# Patient Record
Sex: Female | Born: 1955 | Race: Black or African American | Hispanic: No | Marital: Single | Smoking: Current every day smoker
Health system: Southern US, Community
[De-identification: ages and names within clinical notes are randomized; demographics above are authoritative.]

## PROBLEM LIST (undated history)

## (undated) DIAGNOSIS — E119 Type 2 diabetes mellitus without complications: Secondary | ICD-10-CM

---

## 2004-06-27 ENCOUNTER — Emergency Department (HOSPITAL_COMMUNITY): Admission: EM | Admit: 2004-06-27 | Discharge: 2004-06-27 | Payer: Self-pay | Admitting: Emergency Medicine

## 2009-11-24 ENCOUNTER — Emergency Department (HOSPITAL_COMMUNITY)
Admission: EM | Admit: 2009-11-24 | Discharge: 2009-11-24 | Payer: Self-pay | Source: Home / Self Care | Admitting: Emergency Medicine

## 2010-07-09 ENCOUNTER — Inpatient Hospital Stay (HOSPITAL_COMMUNITY)
Admission: EM | Admit: 2010-07-09 | Discharge: 2010-07-13 | Payer: Self-pay | Source: Home / Self Care | Attending: Internal Medicine | Admitting: Internal Medicine

## 2010-07-10 LAB — POCT I-STAT, CHEM 8
BUN: 20 mg/dL (ref 6–23)
Calcium, Ion: 1.11 mmol/L — ABNORMAL LOW (ref 1.12–1.32)
Chloride: 106 meq/L (ref 96–112)
Creatinine, Ser: 1.3 mg/dL — ABNORMAL HIGH (ref 0.4–1.2)
Glucose, Bld: 436 mg/dL — ABNORMAL HIGH (ref 70–99)
HCT: 39 % (ref 36.0–46.0)
Hemoglobin: 13.3 g/dL (ref 12.0–15.0)
Potassium: 3.4 meq/L — ABNORMAL LOW (ref 3.5–5.1)
Sodium: 138 mEq/L (ref 135–145)
TCO2: 22 mmol/L (ref 0–100)

## 2010-07-10 LAB — DIFFERENTIAL
Basophils Absolute: 0 10*3/uL (ref 0.0–0.1)
Basophils Absolute: 0 10*3/uL (ref 0.0–0.1)
Basophils Relative: 0 % (ref 0–1)
Basophils Relative: 0 % (ref 0–1)
Eosinophils Absolute: 0 10*3/uL (ref 0.0–0.7)
Eosinophils Absolute: 0 10*3/uL (ref 0.0–0.7)
Eosinophils Relative: 0 % (ref 0–5)
Lymphocytes Relative: 3 % — ABNORMAL LOW (ref 12–46)
Lymphs Abs: 0.5 K/uL — ABNORMAL LOW (ref 0.7–4.0)
Monocytes Absolute: 0.4 10*3/uL (ref 0.1–1.0)
Monocytes Relative: 2 % — ABNORMAL LOW (ref 3–12)
Monocytes Relative: 3 % (ref 3–12)
Neutro Abs: 17.3 K/uL — ABNORMAL HIGH (ref 1.7–7.7)
Neutrophils Relative %: 95 % — ABNORMAL HIGH (ref 43–77)
Neutrophils Relative %: 92 % — ABNORMAL HIGH (ref 43–77)

## 2010-07-10 LAB — CBC
HCT: 36.3 % (ref 36.0–46.0)
Hemoglobin: 12.2 g/dL (ref 12.0–15.0)
MCH: 27.6 pg (ref 26.0–34.0)
MCH: 27.4 pg (ref 26.0–34.0)
MCHC: 33.6 g/dL (ref 30.0–36.0)
MCHC: 33.6 g/dL (ref 30.0–36.0)
MCV: 82.1 fL (ref 78.0–100.0)
Platelets: 236 10*3/uL (ref 150–400)
Platelets: 235 10*3/uL (ref 150–400)
RBC: 4.42 MIL/uL (ref 3.87–5.11)
RBC: 4.01 MIL/uL (ref 3.87–5.11)
RDW: 13.6 % (ref 11.5–15.5)
WBC: 18.2 K/uL — ABNORMAL HIGH (ref 4.0–10.5)

## 2010-07-10 LAB — URINE MICROSCOPIC-ADD ON

## 2010-07-10 LAB — URINALYSIS, ROUTINE W REFLEX MICROSCOPIC
Bilirubin Urine: NEGATIVE
Ketones, ur: NEGATIVE mg/dL
Nitrite: POSITIVE — AB
Protein, ur: 30 mg/dL — AB
Specific Gravity, Urine: 1.027 (ref 1.005–1.030)
Urine Glucose, Fasting: >1000 mg/dL — AB
Urobilinogen, UA: 1 mg/dL (ref 0.0–1.0)
pH: 5.5 (ref 5.0–8.0)

## 2010-07-10 LAB — BASIC METABOLIC PANEL
Calcium: 8.7 mg/dL (ref 8.4–10.5)
Chloride: 110 mEq/L (ref 96–112)
Creatinine, Ser: 1.14 mg/dL (ref 0.4–1.2)
GFR calc Af Amer: >60 mL/min (ref >60)

## 2010-07-10 LAB — GLUCOSE, CAPILLARY
Glucose-Capillary: 392 mg/dL — ABNORMAL HIGH (ref 70–99)
Glucose-Capillary: 190 mg/dL — ABNORMAL HIGH (ref 70–99)
Glucose-Capillary: 121 mg/dL — ABNORMAL HIGH (ref 70–99)
Glucose-Capillary: 267 mg/dL — ABNORMAL HIGH (ref 70–99)
Glucose-Capillary: 187 mg/dL — ABNORMAL HIGH (ref 70–99)

## 2010-07-11 LAB — MAGNESIUM: Magnesium: 2.2 mg/dL (ref 1.5–2.5)

## 2010-07-11 LAB — GLUCOSE, CAPILLARY
Glucose-Capillary: 232 mg/dL — ABNORMAL HIGH (ref 70–99)
Glucose-Capillary: 135 mg/dL — ABNORMAL HIGH (ref 70–99)
Glucose-Capillary: 151 mg/dL — ABNORMAL HIGH (ref 70–99)
Glucose-Capillary: 227 mg/dL — ABNORMAL HIGH (ref 70–99)
Glucose-Capillary: 263 mg/dL — ABNORMAL HIGH (ref 70–99)
Glucose-Capillary: 176 mg/dL — ABNORMAL HIGH (ref 70–99)
Glucose-Capillary: 214 mg/dL — ABNORMAL HIGH (ref 70–99)
Glucose-Capillary: 202 mg/dL — ABNORMAL HIGH (ref 70–99)

## 2010-07-11 LAB — CBC
HCT: 30.4 % — ABNORMAL LOW (ref 36.0–46.0)
HCT: 29 % — ABNORMAL LOW (ref 36.0–46.0)
Hemoglobin: 10 g/dL — ABNORMAL LOW (ref 12.0–15.0)
MCH: 26.8 pg (ref 26.0–34.0)
MCHC: 32.9 g/dL (ref 30.0–36.0)
MCHC: 33.8 g/dL (ref 30.0–36.0)
RDW: 14 % (ref 11.5–15.5)

## 2010-07-11 LAB — URINE CULTURE
Colony Count: >=100000
Culture  Setup Time: 201201231228

## 2010-07-11 LAB — BASIC METABOLIC PANEL
CO2: 21 mEq/L (ref 19–32)
CO2: 20 mEq/L (ref 19–32)
Calcium: 8.3 mg/dL — ABNORMAL LOW (ref 8.4–10.5)
Calcium: 8.6 mg/dL (ref 8.4–10.5)
Creatinine, Ser: 0.86 mg/dL (ref 0.4–1.2)
GFR calc Af Amer: >60 mL/min (ref >60)
Glucose, Bld: 150 mg/dL — ABNORMAL HIGH (ref 70–99)
Sodium: 141 mEq/L (ref 135–145)

## 2010-07-11 LAB — IRON AND TIBC
Iron: 30 ug/dL — ABNORMAL LOW (ref 42–135)
Saturation Ratios: 13 % — ABNORMAL LOW (ref 20–55)
UIBC: 204 ug/dL

## 2010-07-11 LAB — FERRITIN: Ferritin: 1234 ng/mL — ABNORMAL HIGH (ref 10–291)

## 2010-07-12 LAB — GLUCOSE, CAPILLARY
Glucose-Capillary: 148 mg/dL — ABNORMAL HIGH (ref 70–99)
Glucose-Capillary: 173 mg/dL — ABNORMAL HIGH (ref 70–99)
Glucose-Capillary: 276 mg/dL — ABNORMAL HIGH (ref 70–99)
Glucose-Capillary: 250 mg/dL — ABNORMAL HIGH (ref 70–99)
Glucose-Capillary: 274 mg/dL — ABNORMAL HIGH (ref 70–99)

## 2010-07-12 LAB — CULTURE, BLOOD (ROUTINE X 2): Culture  Setup Time: 201201231106

## 2010-07-12 LAB — HEMOGLOBIN A1C
Hgb A1c MFr Bld: 9 % — ABNORMAL HIGH (ref <5.7)
Mean Plasma Glucose: 212 mg/dL — ABNORMAL HIGH (ref <117)

## 2010-07-13 LAB — GLUCOSE, CAPILLARY: Glucose-Capillary: 157 mg/dL — ABNORMAL HIGH (ref 70–99)

## 2010-07-13 NOTE — Discharge Summary (Signed)
[name], [name]                ACCOUNT NO.:  [id number]  MEDICAL RECORD NO.:  [id number]          PATIENT TYPE:  INP  LOCATION:  1604                         FACILITY:  [facility]  PHYSICIAN:  Clydia Llano, MD       DATE OF BIRTH:  1955-10-23  DATE OF ADMISSION:  07/09/2010 DATE OF DISCHARGE:                              DISCHARGE SUMMARY   PRIMARY CARE PHYSICIAN:  [name], M.D.  REASON FOR ADMISSION:  Fever, chills.  DISCHARGE DIAGNOSES: 1. Escherichia-coli bacteremia. 2. Escherichia-coli urinary tract infection. 3. Type 2 diabetes mellitus. 4. Anemia.  DISCHARGE MEDICATIONS: 1. Ciprofloxacin 500 mg p.o. b.i.d. take for 11 more days. 2. Actoplus XR 30/1000 mg one tab q.h.s. 3. Aspirin 81 mg p.o. daily.  RADIOLOGY:  Chest x-ray showed lingular opacity, scarring, atelectasis, or developing infiltrates.  BRIEF HISTORY AND EXAMINATION:  Ms. [name] is 55 year old female who presented to the emergency department with complaint of fever, chills, myalgia.  The patient also is coughing for the past 2 days.  The patient reports having dysuria and urinary frequency.  The patient stated that blood sugar have been elevated also.  The patient has been evaluated in the emergency department.  She was found to have urinalysis and questionable pneumonia.  The patient started on Rocephin, azithromycin, referred for medical admission. 1. E-coli UTI and bacteremia.  After the patient's admission, was     started on IV antibiotics.  Her urine culture grew E coli which is     susceptible ciprofloxacin.  The patient was on Rocephin for 1 day     and switched to Zosyn to cover both UTI and pneumonia, but the     patient as mentioned has questionable pneumonia.  She never     complained about any cough during the hospital stay or productive     of sputums, so the patient likely did not have pneumonia.  Because     of the chills and the leukocytosis, blood culture were obtained, 1  out of 2 blood culture grew E coli which has probably came from the     UTI.  The patient will be treated with ciprofloxacin for total 14     days. 2. Diabetes mellitus type 2.  The patient has uncontrolled diabetes     mellitus.  Her hemoglobin A1c is 9, which correlated with mean     plasma glucose of 212.  The patient on metformin 1000 mg extended     release as well as Actos.  The patient probably needs more glycemic     control.  The patient will follow up with her primary care     physician.  She might need actually to be started on insulin. 3. Anemia.  Upon admission in the hospital, the patient's hemoglobin     was 12.2 and dropped in 2 days to around 10.  This is probably the     cause of hemodilution.  When she came in, her blood was     concentrated because of dehydration and after she got the fluid,     that was dropped down.  Anemia panel was obtained and showed iron     of 30 and ferritin is 1234, which is correlated anemia of chronic     disease with a low iron.  It might be also that she has high     ferritin because it is acute-phase reactant, was high just because     of the acute illness.  Upon questioning, the patient did not have     any black stools or history of bleeding before.  The patient needs     to follow up as out patient with her primary care physician for      further evaluation.     Clydia Llano, MD     ME/MEDQ  D:  07/13/2010  T:  07/13/2010  Job:  [id number]  cc:   Candyce Churn. Allyne Cooper, M.D. Fax: [phone]  Electronically Signed by Clydia Llano  on 07/13/2010 08:28:56 PM

## 2010-07-15 LAB — CULTURE, BLOOD (ROUTINE X 2)
Culture  Setup Time: 201201231106
Culture: NO GROWTH

## 2010-08-20 NOTE — H&P (Signed)
[name], [name] NO.:  [id number]  MEDICAL RECORD NO.:  [id number]          PATIENT TYPE:  INP  LOCATION:  1604                         FACILITY:  [facility]  PHYSICIAN:  Della Goo, M.D. DATE OF BIRTH:  06-09-1956  DATE OF ADMISSION:  07/09/2010 DATE OF DISCHARGE:                             HISTORY & PHYSICAL   DATE OF ADMISSION:  July 09, 2010.  CHIEF COMPLAINT:  Fever, chills, cough.  HISTORY OF PRESENT ILLNESS:  This is a 55 year old female who presents to the emergency department with complaints of fever, chills, myalgias and cough for the past 2 days.  She also reports having dysuria and urinary frequency.  She states her blood sugars have been elevated.  The patient was seen and evaluated in the emergency department.  She was found to have a pneumonia on chest x-ray and a positive urinalysis and was started on IV antibiotic therapy of Rocephin and azithromycin and referred for medical admission.  PAST MEDICAL HISTORY:  Significant for type 2 diabetes mellitus.  PAST SURGICAL HISTORY:  History of a bilateral tubal ligation.  MEDICATIONS:  Include Actoplus 30/500 one p.o. daily  ALLERGIES:  No known drug allergies.  SOCIAL HISTORY:  The patient is a smoker.  She smokes 4 cigarettes per day for the past 25 years.  She reports social drinking.  Denies any illicit drug usage.  FAMILY HISTORY:  Positive for hypertension and diabetes in both parents, coronary artery disease in her father and her mother had kidney cancer.  REVIEW OF SYSTEMS:  Pertinents as mentioned above.  PHYSICAL EXAMINATION FINDINGS:  GENERAL:  This is a 55 year old well- nourished, well-developed female in no acute distress. VITAL SIGNS:  Temperature 102.5, blood pressure 112/69, heart rate 110, respirations 18, O2 sats 95% to 98%. HEENT EXAMINATION:  Normocephalic, atraumatic.  Pupils equally round and reactive to light.  Extraocular movements are intact.   Funduscopic benign.  Nares are patent bilaterally.  Oropharynx is clear. NECK:  Supple full range of motion.  No thyromegaly, adenopathy, or jugular venous distention. CARDIOVASCULAR:  Mild tachycardia.  No murmurs, gallops, or rubs appreciated. LUNGS:  Clear to auscultation bilaterally.  Breathing is unlabored. Excursion is symmetric. ABDOMEN:  Positive bowel sounds, soft, nontender, nondistended.  No hepatosplenomegaly.  No suprapubic tenderness. BACK:  No spinous process tenderness.  No CVA tenderness. EXTREMITIES:  Without cyanosis, clubbing or edema. NEUROLOGIC EXAMINATION:  Nonfocal.  LABORATORY STUDIES:  White blood cell count 18.2, hemoglobin 12.2, hematocrit 36.3, platelets 236,000, neutrophils 95%, lymphocytes 3%. Sodium 138, potassium 3.4, chloride 106, CO2 22, BUN 20, creatinine 1.3, and glucose 436.  ASSESSMENT:  A 55 year old female being admitted with; 1. Pneumonia left lingular area seen on chest x-ray. 2. Urinary tract infection. 3. Hyperglycemia. 4. Type 2 diabetes mellitus. 5. Mild hypokalemia.  PLAN:  The patient will be admitted.  Blood cultures x2 have been ordered, and the patient will be placed on Rocephin and azithromycin therapy.  Nebulizer treatments have been ordered along with IV fluids and supplemental oxygen as needed.  The antibiotic therapy should cover the urinary tract infection as well.  However, the antibiotics will  be adjusted pending the culture results of the blood cultures and the urine culture.  Sliding-scale insulin coverage has been ordered for elevated blood sugars.  The Actoplus will be discontinued for now.  The patient will be placed on DVT prophylaxis.  Further workup will ensue pending results of the patient's clinical course.     Della Goo, M.D.     HJ/MEDQ  D:  07/09/2010  T:  07/09/2010  Job:  [id number]  Electronically Signed by Della Goo M.D. on 08/20/2010 07:42:05 PM

## 2016-04-27 ENCOUNTER — Emergency Department (HOSPITAL_COMMUNITY)
Admission: EM | Admit: 2016-04-27 | Discharge: 2016-04-27 | Disposition: A | Discharge status: Home / Self Care | Payer: Worker's Compensation | Attending: Emergency Medicine | Admitting: Emergency Medicine

## 2016-04-27 ENCOUNTER — Encounter (HOSPITAL_COMMUNITY): Payer: Self-pay | Admitting: *Deleted

## 2016-04-27 DIAGNOSIS — Y990 Civilian activity done for income or pay: Secondary | ICD-10-CM | POA: Insufficient documentation

## 2016-04-27 DIAGNOSIS — Z23 Encounter for immunization: Secondary | ICD-10-CM | POA: Diagnosis not present

## 2016-04-27 DIAGNOSIS — S61241A Puncture wound with foreign body of left index finger without damage to nail, initial encounter: Secondary | ICD-10-CM | POA: Diagnosis not present

## 2016-04-27 DIAGNOSIS — F17200 Nicotine dependence, unspecified, uncomplicated: Secondary | ICD-10-CM | POA: Diagnosis not present

## 2016-04-27 DIAGNOSIS — Y9269 Other specified industrial and construction area as the place of occurrence of the external cause: Secondary | ICD-10-CM | POA: Insufficient documentation

## 2016-04-27 DIAGNOSIS — S6992XA Unspecified injury of left wrist, hand and finger(s), initial encounter: Secondary | ICD-10-CM | POA: Diagnosis present

## 2016-04-27 DIAGNOSIS — W458XXA Other foreign body or object entering through skin, initial encounter: Secondary | ICD-10-CM | POA: Insufficient documentation

## 2016-04-27 DIAGNOSIS — M795 Residual foreign body in soft tissue: Secondary | ICD-10-CM

## 2016-04-27 DIAGNOSIS — Y9389 Activity, other specified: Secondary | ICD-10-CM | POA: Diagnosis not present

## 2016-04-27 MED ORDER — LIDOCAINE HCL (PF) 2 % IJ SOLN
2.0000 mL | Freq: Once | INTRAMUSCULAR | Status: DC
  Filled 2016-04-27: qty 10

## 2016-04-27 MED ORDER — TETANUS-DIPHTH-ACELL PERTUSSIS 5-2.5-18.5 LF-MCG/0.5 IM SUSP
0.5000 mL | Freq: Once | INTRAMUSCULAR | Status: AC
  Administered 2016-04-27: 0.5 mL via INTRAMUSCULAR
  Filled 2016-04-27: qty 0.5

## 2016-04-27 NOTE — ED Triage Notes (Signed)
Pt with "threader" in left index finger.

## 2016-04-27 NOTE — ED Notes (Signed)
Taken to lab for urine drug screen for employment requirement- Ed and lab personnel informed

## 2016-04-27 NOTE — ED Provider Notes (Signed)
[facility]-EMERGENCY DEPT Provider Note   CSN: [id number] Arrival date & time: 04/27/16  2020     History   Chief Complaint Chief Complaint  Patient presents with  . Foreign Body    HPI [name] is a 60 y.o. female presenting with a knitting needle in her left index finger which occurred at work prior to arrival.  She works in Designer, fashion/clothingtextiles that [employer] and accidentally was stabbed with a needle.  Her employer, also present has an intact needle to show and it is a small metal rod with a hook at the end resembling a crochet needle.  She has attempted to remove it but was to painful.  She denies numbness distal to the injury site.  She is unsure of her tetanus status.  The history is provided by the patient.    History reviewed. No pertinent past medical history.  There are no active problems to display for this patient.   History reviewed. No pertinent surgical history.  OB History    No data available       Home Medications    Prior to Admission medications   Not on File    Family History History reviewed. No pertinent family history.  Social History Social History  Substance Use Topics  . Smoking status: Current Every Day Smoker  . Smokeless tobacco: Never Used  . Alcohol use Yes     Comment: occ.     Allergies   Patient has no known allergies.   Review of Systems Review of Systems  Constitutional: Negative for chills and fever.  HENT: Negative.   Musculoskeletal: Negative.   Skin: Positive for wound.  Neurological: Negative for numbness.     Physical Exam Updated Vital Signs BP 145/84 (BP Location: Right Arm)   Pulse 85   Temp 97.7 F (36.5 C) (Oral)   Resp 18   SpO2 100%   Physical Exam  Constitutional: She appears well-developed and well-nourished. No distress.  HENT:  Head: Normocephalic.  Neck: Neck supple.  Cardiovascular: Normal rate.   Pulmonary/Chest: Effort normal. She has no wheezes.  Musculoskeletal:  Normal range of motion. She exhibits no edema.  Skin:  Metal hook embedded in left distal index finger, volar.  Distal sensation intact.      ED Treatments / Results  Labs (all labs ordered are listed, but only abnormal results are displayed) Labs Reviewed - No data to display  EKG  EKG Interpretation None       Radiology No results found.  Procedures Procedures (including critical care time)  Pt was given a digital block using lidocaine 2% 1 cc after cleaning finger with wound cleaner spray.  Once numbed, the hook was removed using #18 gauge needle to cover the barb and back out.  Pt tolerated well.  Medications Ordered in ED Medications  lidocaine (XYLOCAINE) 2 % injection 2 mL (2 mLs Other Handoff 04/27/16 2137)  Tdap (BOOSTRIX) injection 0.5 mL (0.5 mLs Intramuscular Given 04/27/16 2142)     Initial Impression / Assessment and Plan / ED Course  I have reviewed the triage vital signs and the nursing notes.  Pertinent labs & imaging results that were available during my care of the patient were reviewed by me and considered in my medical decision making (see chart for details).  Clinical Course     Dressing.  Tetanus updated.  PRN f/u anticipated  Final Clinical Impressions(s) / ED Diagnoses   Final diagnoses:  Foreign body (FB) in soft  tissue    New Prescriptions New Prescriptions   No medications on file     Burgess AmorJulie Shelagh Rayman, Cordelia Poche-C 04/27/16 2145    Samuel JesterKathleen McManus, DO 04/28/16 2215

## 2016-09-20 ENCOUNTER — Observation Stay (HOSPITAL_COMMUNITY)
Admission: EM | Admit: 2016-09-20 | Discharge: 2016-09-21 | Disposition: A | Discharge status: Home / Self Care | Payer: BLUE CROSS/BLUE SHIELD | Attending: Internal Medicine | Admitting: Internal Medicine

## 2016-09-20 ENCOUNTER — Encounter (HOSPITAL_COMMUNITY): Payer: Self-pay | Admitting: *Deleted

## 2016-09-20 ENCOUNTER — Emergency Department (HOSPITAL_COMMUNITY): Payer: BLUE CROSS/BLUE SHIELD

## 2016-09-20 DIAGNOSIS — Z7982 Long term (current) use of aspirin: Secondary | ICD-10-CM | POA: Insufficient documentation

## 2016-09-20 DIAGNOSIS — N179 Acute kidney failure, unspecified: Principal | ICD-10-CM | POA: Diagnosis present

## 2016-09-20 DIAGNOSIS — I959 Hypotension, unspecified: Secondary | ICD-10-CM

## 2016-09-20 DIAGNOSIS — Z794 Long term (current) use of insulin: Secondary | ICD-10-CM | POA: Insufficient documentation

## 2016-09-20 DIAGNOSIS — E1165 Type 2 diabetes mellitus with hyperglycemia: Secondary | ICD-10-CM | POA: Diagnosis not present

## 2016-09-20 DIAGNOSIS — N289 Disorder of kidney and ureter, unspecified: Secondary | ICD-10-CM

## 2016-09-20 DIAGNOSIS — R739 Hyperglycemia, unspecified: Secondary | ICD-10-CM

## 2016-09-20 DIAGNOSIS — R42 Dizziness and giddiness: Secondary | ICD-10-CM | POA: Diagnosis present

## 2016-09-20 DIAGNOSIS — F17200 Nicotine dependence, unspecified, uncomplicated: Secondary | ICD-10-CM | POA: Diagnosis not present

## 2016-09-20 DIAGNOSIS — E119 Type 2 diabetes mellitus without complications: Secondary | ICD-10-CM

## 2016-09-20 HISTORY — DX: Type 2 diabetes mellitus without complications: E11.9

## 2016-09-20 LAB — BASIC METABOLIC PANEL
ANION GAP: 9 (ref 5–15)
BUN: 20 mg/dL (ref 6–20)
CALCIUM: 8.8 mg/dL — AB (ref 8.9–10.3)
CO2: 22 mmol/L (ref 22–32)
Chloride: 103 mmol/L (ref 101–111)
Creatinine, Ser: 1.93 mg/dL — ABNORMAL HIGH (ref 0.44–1.00)
GFR calc non Af Amer: 27 mL/min — ABNORMAL LOW (ref >60)
GFR, EST AFRICAN AMERICAN: 32 mL/min — AB (ref >60)
Glucose, Bld: 357 mg/dL — ABNORMAL HIGH (ref 65–99)
Potassium: 4 mmol/L (ref 3.5–5.1)
Sodium: 134 mmol/L — ABNORMAL LOW (ref 135–145)

## 2016-09-20 LAB — URINALYSIS, ROUTINE W REFLEX MICROSCOPIC
Bilirubin Urine: NEGATIVE
GLUCOSE, UA: 50 mg/dL — AB
Hgb urine dipstick: NEGATIVE
KETONES UR: NEGATIVE mg/dL
NITRITE: NEGATIVE
PH: 5 (ref 5.0–8.0)
Protein, ur: 100 mg/dL — AB
SPECIFIC GRAVITY, URINE: 1.01 (ref 1.005–1.030)

## 2016-09-20 LAB — CBC WITH DIFFERENTIAL/PLATELET
Basophils Absolute: 0 10*3/uL (ref 0.0–0.1)
Basophils Relative: 0 %
Eosinophils Absolute: 0 10*3/uL (ref 0.0–0.7)
Eosinophils Relative: 0 %
HEMATOCRIT: 39.8 % (ref 36.0–46.0)
HEMOGLOBIN: 13 g/dL (ref 12.0–15.0)
LYMPHS ABS: 1.4 10*3/uL (ref 0.7–4.0)
LYMPHS PCT: 14 %
MCH: 27 pg (ref 26.0–34.0)
MCHC: 32.7 g/dL (ref 30.0–36.0)
MCV: 82.7 fL (ref 78.0–100.0)
MONOS PCT: 12 %
Monocytes Absolute: 1.2 10*3/uL — ABNORMAL HIGH (ref 0.1–1.0)
NEUTROS ABS: 7.4 10*3/uL (ref 1.7–7.7)
Neutrophils Relative %: 74 %
Platelets: 224 10*3/uL (ref 150–400)
RBC: 4.81 MIL/uL (ref 3.87–5.11)
RDW: 13.7 % (ref 11.5–15.5)
WBC: 10 10*3/uL (ref 4.0–10.5)

## 2016-09-20 LAB — TROPONIN I: Troponin I: <0.03 ng/mL (ref <0.03)

## 2016-09-20 LAB — CBG MONITORING, ED: Glucose-Capillary: 322 mg/dL — ABNORMAL HIGH (ref 65–99)

## 2016-09-20 MED ORDER — MECLIZINE HCL 25 MG PO TABS
25.0000 mg | ORAL_TABLET | Freq: Once | ORAL | Status: AC
  Administered 2016-09-20: 25 mg via ORAL
  Filled 2016-09-20: qty 1

## 2016-09-20 NOTE — ED Notes (Signed)
Pt's cbg =322

## 2016-09-20 NOTE — ED Notes (Signed)
EKG aquired with portable ekg machine.

## 2016-09-20 NOTE — ED Triage Notes (Signed)
Pt complains of lightheadedness, blurred vision since 4PM today. Pt states she has been taking allergy medication for the past 2 days for cough, sneezing. Pt's CBG was 263 at home.

## 2016-09-20 NOTE — ED Provider Notes (Signed)
[facility]-EMERGENCY DEPT Provider Note   CSN: [id number] Arrival date & time: 09/20/16  1936     History   Chief Complaint Chief Complaint  Patient presents with  . Blurred Vision  . Dizziness    HPI [name] is a 61 y.o. female.  Patient presents with lightheadedness, blurred vision, since approximately 1600 today. She started her blood pressure medicine approximately one month ago (lisinopril 10 mg).  No gross neurological deficits, fever, chills, meningeal signs. She works at a [employer] as a Public affairs consultant. Review of systems positive for coughing and sneezing secondary to allergies. Her glucose has been in the 160-260 range.      Past Medical History:  Diagnosis Date  . Diabetes mellitus without complication (HCC)     There are no active problems to display for this patient.   History reviewed. No pertinent surgical history.  OB History    No data available       Home Medications    Prior to Admission medications   Medication Sig Start Date End Date Taking? Authorizing Provider  aspirin EC 81 MG tablet Take 81 mg by mouth daily.   Yes Historical Provider, MD  B Complex-Folic Acid (B COMPLEX-VITAMIN B12 PO) Take 1 tablet by mouth daily.   Yes Historical Provider, MD  ibuprofen (ADVIL,MOTRIN) 200 MG tablet Take 200 mg by mouth every 6 (six) hours as needed for moderate pain.   Yes Historical Provider, MD  insulin detemir (LEVEMIR) 100 UNIT/ML injection Inject 22 Units into the skin at bedtime.   Yes Historical Provider, MD  lisinopril (PRINIVIL,ZESTRIL) 10 MG tablet Take 10 mg by mouth daily.   Yes Historical Provider, MD  Multiple Vitamins-Minerals (ALIVE ONCE DAILY WOMENS PO) Take 1 tablet by mouth daily.   Yes Historical Provider, MD  simvastatin (ZOCOR) 10 MG tablet Take 10 mg by mouth daily.   Yes Historical Provider, MD  tetrahydrozoline-zinc (EYE DROPS ALLERGY RELIEF) 0.05-0.25 % ophthalmic solution Place 2 drops into both eyes 3 (three) times daily as needed.  Itching   Yes Historical Provider, MD    Family History No family history on file.  Social History Social History  Substance Use Topics  . Smoking status: Current Every Day Smoker  . Smokeless tobacco: Never Used  . Alcohol use Yes     Comment: occ.     Allergies   Patient has no known allergies.   Review of Systems Review of Systems  All other systems reviewed and are negative.    Physical Exam Updated Vital Signs BP 107/77 (BP Location: Right Arm)   Pulse 73   Temp 97.9 F (36.6 C) (Oral)   Resp 12   Wt 143 lb 8 oz (65.1 kg)   SpO2 99%   Physical Exam  Constitutional: She is oriented to person, place, and time. She appears well-developed and well-nourished.  Mild hypotension  HENT:  Head: Normocephalic and atraumatic.  Eyes: Conjunctivae are normal.  Neck: Neck supple.  Cardiovascular: Normal rate and regular rhythm.   Pulmonary/Chest: Effort normal and breath sounds normal.  Abdominal: Soft. Bowel sounds are normal.  Musculoskeletal: Normal range of motion.  Neurological: She is alert and oriented to person, place, and time.  Skin: Skin is warm and dry.  Psychiatric: She has a normal mood and affect. Her behavior is normal.  Nursing note and vitals reviewed.    ED Treatments / Results  Labs (all labs ordered are listed, but only abnormal results are displayed) Labs Reviewed  BASIC  METABOLIC PANEL - Abnormal; Notable for the following:       Result Value   Sodium 134 (*)    Glucose, Bld 357 (*)    Creatinine, Ser 1.93 (*)    Calcium 8.8 (*)    GFR calc non Af Amer 27 (*)    GFR calc Af Amer 32 (*)    All other components within normal limits  URINALYSIS, ROUTINE W REFLEX MICROSCOPIC - Abnormal; Notable for the following:    APPearance CLOUDY (*)    Glucose, UA 50 (*)    Protein, ur 100 (*)    Leukocytes, UA TRACE (*)    Bacteria, UA RARE (*)    Squamous Epithelial / LPF 0-5 (*)    Crystals PRESENT (*)    All other components within normal  limits  CBC WITH DIFFERENTIAL/PLATELET - Abnormal; Notable for the following:    Monocytes Absolute 1.2 (*)    All other components within normal limits  CBG MONITORING, ED - Abnormal; Notable for the following:    Glucose-Capillary 322 (*)    All other components within normal limits  TROPONIN I    EKG  EKG Interpretation None      Date: 09/20/2016  Rate: 74  Rhythm: normal sinus rhythm  QRS Axis: normal  Intervals: normal  ST/T Wave abnormalities: normal  Conduction Disutrbances: none  Narrative Interpretation: unremarkable     Radiology Ct Head Wo Contrast  Result Date: 09/20/2016 CLINICAL DATA:  Dizziness and near syncope.  Blurred vision. EXAM: CT HEAD WITHOUT CONTRAST TECHNIQUE: Contiguous axial images were obtained from the base of the skull through the vertex without intravenous contrast. COMPARISON:  None. FINDINGS: Brain: No mass lesion, intraparenchymal hemorrhage or extra-axial collection. No evidence of acute cortical infarct. Brain parenchyma and CSF-containing spaces are normal for age. Bilateral basal ganglia mineralization. Vascular: No hyperdense vessel or unexpected calcification. Skull: Normal visualized skull base, calvarium and extracranial soft tissues. Sinuses/Orbits: No sinus fluid levels or advanced mucosal thickening. No mastoid effusion. Normal orbits. IMPRESSION: Normal head CT for age. Electronically Signed   By: Deatra Robinson M.D.   On: 09/20/2016 21:40    Procedures Procedures (including critical care time)  Medications Ordered in ED Medications  sodium chloride 0.9 % bolus 1,000 mL (not administered)  meclizine (ANTIVERT) tablet 25 mg (25 mg Oral Given 09/20/16 2156)     Initial Impression / Assessment and Plan / ED Course  I have reviewed the triage vital signs and the nursing notes.  Pertinent labs & imaging results that were available during my care of the patient were reviewed by me and considered in my medical decision making (see chart  for details).     I'm concerned about the patient's hypotension and her elevated creatinine. These both could be related to the lisinopril. Additionally her glucose is elevated. Will hydrate and admit for observation.  Final Clinical Impressions(s) / ED Diagnoses   Final diagnoses:  Dizziness  Hyperglycemia  Renal insufficiency    New Prescriptions New Prescriptions   No medications on file     Donnetta Hutching, MD 09/21/16 0050

## 2016-09-21 DIAGNOSIS — R42 Dizziness and giddiness: Secondary | ICD-10-CM | POA: Diagnosis not present

## 2016-09-21 DIAGNOSIS — R739 Hyperglycemia, unspecified: Secondary | ICD-10-CM

## 2016-09-21 DIAGNOSIS — E119 Type 2 diabetes mellitus without complications: Secondary | ICD-10-CM | POA: Diagnosis not present

## 2016-09-21 DIAGNOSIS — Z794 Long term (current) use of insulin: Secondary | ICD-10-CM

## 2016-09-21 DIAGNOSIS — I959 Hypotension, unspecified: Secondary | ICD-10-CM

## 2016-09-21 DIAGNOSIS — N179 Acute kidney failure, unspecified: Secondary | ICD-10-CM | POA: Diagnosis present

## 2016-09-21 LAB — HEPATIC FUNCTION PANEL
ALBUMIN: 3 g/dL — AB (ref 3.5–5.0)
ALK PHOS: 92 U/L (ref 38–126)
ALT: 20 U/L (ref 14–54)
AST: 23 U/L (ref 15–41)
BILIRUBIN TOTAL: 0.4 mg/dL (ref 0.3–1.2)
Bilirubin, Direct: <0.1 mg/dL — ABNORMAL LOW (ref 0.1–0.5)
Total Protein: 6.1 g/dL — ABNORMAL LOW (ref 6.5–8.1)

## 2016-09-21 LAB — BASIC METABOLIC PANEL
ANION GAP: 7 (ref 5–15)
BUN: 17 mg/dL (ref 6–20)
CO2: 21 mmol/L — AB (ref 22–32)
Calcium: 8.2 mg/dL — ABNORMAL LOW (ref 8.9–10.3)
Chloride: 110 mmol/L (ref 101–111)
Creatinine, Ser: 0.96 mg/dL (ref 0.44–1.00)
GFR calc non Af Amer: >60 mL/min (ref >60)
Glucose, Bld: 272 mg/dL — ABNORMAL HIGH (ref 65–99)
Potassium: 3.5 mmol/L (ref 3.5–5.1)
Sodium: 138 mmol/L (ref 135–145)

## 2016-09-21 LAB — GLUCOSE, CAPILLARY
GLUCOSE-CAPILLARY: 257 mg/dL — AB (ref 65–99)
Glucose-Capillary: 173 mg/dL — ABNORMAL HIGH (ref 65–99)
Glucose-Capillary: 128 mg/dL — ABNORMAL HIGH (ref 65–99)

## 2016-09-21 MED ORDER — SODIUM CHLORIDE 0.9 % IV SOLN
INTRAVENOUS | Status: DC
  Administered 2016-09-21: 02:00:00 via INTRAVENOUS

## 2016-09-21 MED ORDER — INSULIN DETEMIR 100 UNIT/ML ~~LOC~~ SOLN: 25.0000 [IU] | Freq: Every day | SUBCUTANEOUS | 11 refills | Status: DC

## 2016-09-21 MED ORDER — LORATADINE 10 MG PO TABS
10.0000 mg | ORAL_TABLET | Freq: Every day | ORAL | Status: DC
  Administered 2016-09-21: 10 mg via ORAL
  Filled 2016-09-21: qty 1

## 2016-09-21 MED ORDER — SODIUM CHLORIDE 0.9% FLUSH: 3.0000 mL | Freq: Two times a day (BID) | INTRAVENOUS | Status: DC

## 2016-09-21 MED ORDER — SODIUM CHLORIDE 0.9 % IV BOLUS (SEPSIS)
1000.0000 mL | Freq: Once | INTRAVENOUS | Status: AC
  Administered 2016-09-21: 1000 mL via INTRAVENOUS

## 2016-09-21 MED ORDER — ASPIRIN EC 81 MG PO TBEC
81.0000 mg | DELAYED_RELEASE_TABLET | Freq: Every day | ORAL | Status: DC
  Administered 2016-09-21: 81 mg via ORAL
  Filled 2016-09-21: qty 1

## 2016-09-21 MED ORDER — INSULIN DETEMIR 100 UNIT/ML ~~LOC~~ SOLN
22.0000 [IU] | Freq: Every day | SUBCUTANEOUS | Status: DC
  Filled 2016-09-21 (×2): qty 0.22

## 2016-09-21 MED ORDER — INSULIN ASPART 100 UNIT/ML ~~LOC~~ SOLN
0.0000 [IU] | Freq: Three times a day (TID) | SUBCUTANEOUS | Status: DC
  Administered 2016-09-21: 8 [IU] via SUBCUTANEOUS
  Administered 2016-09-21: 2 [IU] via SUBCUTANEOUS

## 2016-09-21 MED ORDER — ACETAMINOPHEN 650 MG RE SUPP: 650.0000 mg | Freq: Four times a day (QID) | RECTAL | Status: DC | PRN

## 2016-09-21 MED ORDER — ONDANSETRON HCL 4 MG PO TABS: 4.0000 mg | ORAL_TABLET | Freq: Four times a day (QID) | ORAL | Status: DC | PRN

## 2016-09-21 MED ORDER — INSULIN ASPART 100 UNIT/ML ~~LOC~~ SOLN: 0.0000 [IU] | Freq: Every day | SUBCUTANEOUS | Status: DC

## 2016-09-21 MED ORDER — LIVING WELL WITH DIABETES BOOK
Freq: Once | Status: DC
  Filled 2016-09-21: qty 1

## 2016-09-21 MED ORDER — ONDANSETRON HCL 4 MG/2ML IJ SOLN: 4.0000 mg | Freq: Four times a day (QID) | INTRAMUSCULAR | Status: DC | PRN

## 2016-09-21 MED ORDER — SIMVASTATIN 10 MG PO TABS: 10.0000 mg | ORAL_TABLET | Freq: Every day | ORAL | Status: DC

## 2016-09-21 MED ORDER — SODIUM CHLORIDE 0.9 % IV BOLUS (SEPSIS)
500.0000 mL | Freq: Once | INTRAVENOUS | Status: AC
  Administered 2016-09-21: 500 mL via INTRAVENOUS

## 2016-09-21 MED ORDER — ACETAMINOPHEN 325 MG PO TABS: 650.0000 mg | ORAL_TABLET | Freq: Four times a day (QID) | ORAL | Status: DC | PRN

## 2016-09-21 NOTE — Progress Notes (Signed)
Discharged instructions reviewed with patient utilizing teach back method. Writer and patient spoke with diabetes coordinator Bjorn Loser) because patient had questions about her DM management. Patient discharged to home

## 2016-09-21 NOTE — Discharge Instructions (Signed)
Orthostatic Hypotension Orthostatic hypotension is a sudden drop in blood pressure that happens when you quickly change positions, such as when you get up from a seated or lying position. Blood pressure is a measurement of how strongly, or weakly, your blood is pressing against the walls of your arteries. Arteries are blood vessels that carry blood from your heart throughout your body. When blood pressure is too low, you may not get enough blood to your brain or to the rest of your organs. This can cause weakness, light-headedness, rapid heartbeat, and fainting. This can last for just a few seconds or for up to a few minutes. Orthostatic hypotension is usually not a serious problem. However, if it happens frequently or gets worse, it may be a sign of something more serious. What are the causes? This condition may be caused by:  Sudden changes in posture, such as standing up quickly after you have been sitting or lying down.  Blood loss.  Loss of body fluids (dehydration).  Heart problems.  Hormone (endocrine) problems.  Pregnancy.  Severe infection.  Lack of certain nutrients.  Severe allergic reactions (anaphylaxis).  Certain medicines, such as blood pressure medicine or medicines that make the body lose excess fluids (diuretics). Sometimes, this condition can be caused by not taking medicine as directed, such as taking too much of a certain medicine. What increases the risk? Certain factors can make you more likely to develop orthostatic hypotension, including:  Age. Risk increases as you get older.  Conditions that affect the heart or the central nervous system.  Taking certain medicines, such as blood pressure medicine or diuretics.  Being pregnant. What are the signs or symptoms? Symptoms of this condition may include:  Weakness.  Light-headedness.  Dizziness.  Blurred vision.  Fatigue.  Rapid heartbeat.  Fainting, in severe cases. How is this diagnosed? This  condition is diagnosed based on:  Your medical history.  Your symptoms.  Your blood pressure measurement. Your health care provider will check your blood pressure when you are:  Lying down.  Sitting.  Standing. A blood pressure reading is recorded as two numbers, such as "120 over 80" (or 120/80). The first ("top") number is called the systolic pressure. It is a measure of the pressure in your arteries as your heart beats. The second ("bottom") number is called the diastolic pressure. It is a measure of the pressure in your arteries when your heart relaxes between beats. Blood pressure is measured in a unit called mm Hg. Healthy blood pressure for adults is 120/80. If your blood pressure is below 90/60, you may be diagnosed with hypotension. Other information or tests that may be used to diagnose orthostatic hypotension include:  Your other vital signs, such as your heart rate and temperature.  Blood tests.  Tilt table test. For this test, you will be safely secured to a table that moves you from a lying position to an upright position. Your heart rhythm and blood pressure will be monitored during the test. How is this treated? Treatment for this condition may include:  Changing your diet. This may involve eating more salt (sodium) or drinking more water.  Taking medicines to raise your blood pressure.  Changing the dosage of certain medicines you are taking that might be lowering your blood pressure.  Wearing compression stockings. These stockings help to prevent blood clots and reduce swelling in your legs. In some cases, you may need to go to the hospital for:  Fluid replacement. This means you will   receive fluids through an IV tube.  Blood replacement. This means you will receive donated blood through an IV tube (transfusion).  Treating an infection or heart problems, if this applies.  Monitoring. You may need to be monitored while medicines that you are taking wear  off. Follow these instructions at home: Eating and drinking    Drink enough fluid to keep your urine clear or pale yellow.  Eat a healthy diet and follow instructions from your health care provider about eating or drinking restrictions. A healthy diet includes:  Fresh fruits and vegetables.  Whole grains.  Lean meats.  Low-fat dairy products.  Eat extra salt only as directed. Do not add extra salt to your diet unless your health care provider told you to do that.  Eat frequent, small meals.  Avoid standing up suddenly after eating. Medicines   Take over-the-counter and prescription medicines only as told by your health care provider.  Follow instructions from your health care provider about changing the dosage of your current medicines, if this applies.  Do not stop or adjust any of your medicines on your own. General instructions   Wear compression stockings as told by your health care provider.  Get up slowly from lying down or sitting positions. This gives your blood pressure a chance to adjust.  Avoid hot showers and excessive heat as directed by your health care provider.  Return to your normal activities as told by your health care provider. Ask your health care provider what activities are safe for you.  Do not use any products that contain nicotine or tobacco, such as cigarettes and e-cigarettes. If you need help quitting, ask your health care provider.  Keep all follow-up visits as told by your health care provider. This is important. Contact a health care provider if:  You vomit.  You have diarrhea.  You have a fever for more than 2-3 days.  You feel more thirsty than usual.  You feel weak and tired. Get help right away if:  You have chest pain.  You have a fast or irregular heartbeat.  You develop numbness in any part of your body.  You cannot move your arms or your legs.  You have trouble speaking.  You become sweaty or feel  lightheaded.  You faint.  You feel short of breath.  You have trouble staying awake.  You feel confused. This information is not intended to replace advice given to you by your health care provider. Make sure you discuss any questions you have with your health care provider. Document Released: 05/24/2002 Document Revised: 02/20/2016 Document Reviewed: 11/24/2015 Elsevier Interactive Patient Education  2017 [employer]  

## 2016-09-21 NOTE — Progress Notes (Addendum)
Pt had an EKG in ED as ordered, has not crossed over to epic, hard copy shown to MD in ED by Jari Favre, RN. Jari Favre, RN states that the hard copy is going to medical record to be scanned into epic. [name]

## 2016-09-21 NOTE — Discharge Summary (Signed)
[facility]  Physician Discharge Summary   Patient ID: [name] MRN: [id number] DOB/AGE: 12-12-1955 61 y.o.  Admit date: 09/20/2016 Discharge date: 09/21/2016  PCP: No PCP Per Patient  DISCHARGE DIAGNOSES:  Principal Problem:   AKI (acute kidney injury) (HCC) Active Problems:   Hypotension   Diabetes (HCC)   RECOMMENDATIONS FOR OUTPATIENT FOLLOW UP: 1. Patient instructed to follow-up with her primary care provider early next week 2. She was asked to reschedule her routine colonoscopy  DISCHARGE CONDITION: fair  Diet recommendation: Modified carbohydrate  Filed Weights   09/20/16 1941 09/21/16 0159  Weight: 65.1 kg (143 lb 8 oz) 76.4 kg (168 lb 6.9 oz)    INITIAL HISTORY: 61 y.o. woman with a history of Type 2 DM, HLD, and seasonal allergies who presented to the ED for evaluation of intermittent dizziness, blurred vision, and headaches for the past several days.  She has felt light-headed, but she had not passed out. She was recently started on lisinopril.   HOSPITAL COURSE:   Acute Kidney injury Patient creatinine was noted to be 1.9. Acute renal failure occurred in the setting of relative hypotension, all likely driven by addition of lisinopril in the past month. She may also have a small degree of dehydration in the setting of recent URI. Lisinopril was held. She was given normal saline. Orthostatics were checked and her blood pressure did drop some with standing position. After fluid boluses orthostatics were checked again this morning and showed improvement. She ambulated without any difficulty. Creatinine is now normal. Recommend continue to hold her lisinopril for now.  Uncontrolled Type 2 Diabetes Her dose of Levemir was increased. She was asked to follow-up with her primary care provider.  Hyperlipidemia  Continue Statin  URI, likely some component of seasonal allergies with recent changes in weather Low index of suspicion for bacterial process  at this point. She remained afebrile. Symptoms have improved.  Of note, due for colonoscopy on Tuesday. Patient advised to follow up with her PCP early next week. It may not be unreasonable for her routine colonoscopy to be rescheduled.   Overall, stable. Renal failure has resolved. Okay for discharge home.   PERTINENT LABS:  The results of significant diagnostics from this hospitalization (including imaging, microbiology, ancillary and laboratory) are listed below for reference.     Labs: Basic Metabolic Panel:  Recent Labs Lab 09/20/16 2101 09/21/16 0700  NA 134* 138  K 4.0 3.5  CL 103 110  CO2 22 21*  GLUCOSE 357* 272*  BUN 20 17  CREATININE 1.93* 0.96  CALCIUM 8.8* 8.2*   Liver Function Tests:  Recent Labs Lab 09/21/16 0700  AST 23  ALT 20  ALKPHOS 92  BILITOT 0.4  PROT 6.1*  ALBUMIN 3.0*   CBC:  Recent Labs Lab 09/20/16 2102  WBC 10.0  NEUTROABS 7.4  HGB 13.0  HCT 39.8  MCV 82.7  PLT 224   Cardiac Enzymes:  Recent Labs Lab 09/20/16 2101  TROPONINI <0.03   CBG:  Recent Labs Lab 09/20/16 2117 09/21/16 0208 09/21/16 0722 09/21/16 1125  GLUCAP 322* 173* 257* 128*     IMAGING STUDIES Ct Head Wo Contrast  Result Date: 09/20/2016 CLINICAL DATA:  Dizziness and near syncope.  Blurred vision. EXAM: CT HEAD WITHOUT CONTRAST TECHNIQUE: Contiguous axial images were obtained from the base of the skull through the vertex without intravenous contrast. COMPARISON:  None. FINDINGS: Brain: No mass lesion, intraparenchymal hemorrhage or extra-axial collection. No evidence of acute cortical infarct. Brain  parenchyma and CSF-containing spaces are normal for age. Bilateral basal ganglia mineralization. Vascular: No hyperdense vessel or unexpected calcification. Skull: Normal visualized skull base, calvarium and extracranial soft tissues. Sinuses/Orbits: No sinus fluid levels or advanced mucosal thickening. No mastoid effusion. Normal orbits. IMPRESSION: Normal  head CT for age. Electronically Signed   By: Deatra Robinson M.D.   On: 09/20/2016 21:40    DISCHARGE EXAMINATION: Vitals:   09/21/16 0159 09/21/16 0201 09/21/16 0204 09/21/16 0543  BP: 116/73 103/71 90/62 94/62   Pulse: 68 74 79 80  Resp: 18   18  Temp: 98.9 F (37.2 C)   99.3 F (37.4 C)  TempSrc: Oral   Oral  SpO2:    94%  Weight: 76.4 kg (168 lb 6.9 oz)     Height:  (1.702 m)      Orthostatic VS for the past 24 hrs:  BP- Lying Pulse- Lying BP- Sitting Pulse- Sitting BP- Standing at 0 minutes Pulse- Standing at 0 minutes  09/21/16 1100 123/78 75 115/77 77 111/73 80  09/21/16 0204 - - - - 90/62 79  09/21/16 0201 - - 103/71 74 - -  09/21/16 0159 116/73 68 - - - -     General appearance: alert, cooperative, appears stated age and no distress Resp: clear to auscultation bilaterally Cardio: regular rate and rhythm, S1, S2 normal, no murmur, click, rub or gallop GI: soft, non-tender; bowel sounds normal; no masses,  no organomegaly Extremities: extremities normal, atraumatic, no cyanosis or edema Neurologic: Alert and oriented X 3, normal strength and tone. Normal symmetric reflexes. Normal coordination and gait  DISPOSITION: Home  Discharge Instructions    Call MD for:  difficulty breathing, headache or visual disturbances    Complete by:  As directed    Call MD for:  extreme fatigue    Complete by:  As directed    Call MD for:  persistant dizziness or light-headedness    Complete by:  As directed    Call MD for:  persistant nausea and vomiting    Complete by:  As directed    Call MD for:  severe uncontrolled pain    Complete by:  As directed    Call MD for:  temperature >100.4    Complete by:  As directed    Diet Carb Modified    Complete by:  As directed    Discharge instructions    Complete by:  As directed    Please stop taking Lisinopril for now. See your primary care physician early next week. It is reasonable at this time to postpone your routine colonoscopy  until you've completely recovered. Keep yourself well hydrated for the next few days. Get up slowly from a sitting or lying down position. Your diabetes is not well controlled. Dose of insulin has been increased slightly. Discuss this further with your PCP.  cared for by a hospitalist during your hospital stay. If you have any questions about your discharge medications or the care you received while you were in the hospital after you are discharged, you can call the unit and asked to speak with the hospitalist on call if the hospitalist that took care of you is not available. Once you are discharged, your primary care physician will handle any further medical issues. Please note that NO REFILLS for any discharge medications will be authorized once you are discharged, as it is imperative that you return to your primary care physician (or establish a relationship with a primary care physician  if you do not have one) for your aftercare needs so that they can reassess your need for medications and monitor your lab values. If you do not have a primary care physician, you can call [phone] for a physician referral.   Increase activity slowly    Complete by:  As directed       ALLERGIES: No Known Allergies   Discharge Medication List as of 09/21/2016 12:55 PM    CONTINUE these medications which have CHANGED   Details  insulin detemir (LEVEMIR) 100 UNIT/ML injection Inject 0.25 mLs (25 Units total) into the skin at bedtime., Starting Sat 09/21/2016, No Print      CONTINUE these medications which have NOT CHANGED   Details  aspirin EC 81 MG tablet Take 81 mg by mouth daily., Historical Med    B Complex-Folic Acid (B COMPLEX-VITAMIN B12 PO) Take 1 tablet by mouth daily., Historical Med    Multiple Vitamins-Minerals (ALIVE ONCE DAILY WOMENS PO) Take 1 tablet by mouth daily., Historical Med    simvastatin (ZOCOR) 10 MG tablet Take 10 mg by mouth daily., Historical Med    tetrahydrozoline-zinc (EYE DROPS  ALLERGY RELIEF) 0.05-0.25 % ophthalmic solution Place 2 drops into both eyes 3 (three) times daily as needed. Itching, Historical Med      STOP taking these medications     ibuprofen (ADVIL,MOTRIN) 200 MG tablet      lisinopril (PRINIVIL,ZESTRIL) 10 MG tablet          TOTAL DISCHARGE TIME: 35 mins  [facility]  [facility] Pager [phone]  09/21/2016, 2:34 PM

## 2016-09-21 NOTE — H&P (Signed)
History and Physical    DEWANNA HURSTON ZOX:[id number] DOB: 06-Jul-1955 DOA: 09/20/2016  PCP: PCP is in [place], [place]  Patient coming from: Work  Chief Complaint: Dizziness and blurred vision  HPI: [name] is a 61 y.o. woman with a history of Type 2 DM, HLD, and seasonal allergies who presents to the ED for evaluation of intermittent dizziness, blurred vision, and headaches for the past several days.  She has felt light-headed, but she has not passed out.  She has also had cough, sore throat, and nasal congestion since Monday.  She has been taking an OTC cough and cold prep that contains phenylephrine.  Her cough is nonproductive.  She denies post-nasal drip.  No known sick contacts.  No fever.  She has had mild nausea but no vomiting.  No diarrhea.  Of note, her PCP put her on insulin for her uncontrolled diabetes in the past month and he also started lisinopril "for renal protection".  Per the patient, she has never had high blood pressure.  ED Course:  BP in triage 88/60.  Improved without specific intervention.  Creatinine 1.93, which is new.  Glucose 357.  Head CT negative.  Troponin negative.  She received one dose of meclizine.  Hospitalist asked to admit for AKI in the setting of relative hypotension.  Review of Systems: As per HPI otherwise 10 systems reviewed and negative.    Past Medical History:  Diagnosis Date  . Diabetes mellitus without complication (HCC)   HLD  History reviewed. No pertinent surgical history.  The patient denies major surgeries.   reports that she has been smoking.  She has never used smokeless tobacco. She reports that she drinks alcohol. She reports that she does not use drugs.  Occasional tobacco and EtOH use but no history of dependence.  No Known Allergies  FAMILY HISTORY: Diabetes and HTN are prevalent.  Parents are deceased.  Father had prostate cancer.  Mother had metastatic cancer (patient unsure of primary).  Prior to Admission  medications   Medication Sig Start Date End Date Taking? Authorizing Provider  aspirin EC 81 MG tablet Take 81 mg by mouth daily.   Yes Historical Provider, MD  B Complex-Folic Acid (B COMPLEX-VITAMIN B12 PO) Take 1 tablet by mouth daily.   Yes Historical Provider, MD  ibuprofen (ADVIL,MOTRIN) 200 MG tablet Take 200 mg by mouth every 6 (six) hours as needed for moderate pain.   Yes Historical Provider, MD  insulin detemir (LEVEMIR) 100 UNIT/ML injection Inject 22 Units into the skin at bedtime.   Yes Historical Provider, MD  lisinopril (PRINIVIL,ZESTRIL) 10 MG tablet Take 10 mg by mouth daily.   Yes Historical Provider, MD  Multiple Vitamins-Minerals (ALIVE ONCE DAILY WOMENS PO) Take 1 tablet by mouth daily.   Yes Historical Provider, MD  simvastatin (ZOCOR) 10 MG tablet Take 10 mg by mouth daily.   Yes Historical Provider, MD  tetrahydrozoline-zinc (EYE DROPS ALLERGY RELIEF) 0.05-0.25 % ophthalmic solution Place 2 drops into both eyes 3 (three) times daily as needed. Itching   Yes Historical Provider, MD    Physical Exam: Vitals:   09/20/16 1941 09/20/16 1944 09/20/16 2004 09/20/16 2226  BP: (!) 88/60 (!) 86/60 101/63 107/77  Pulse: 80  74 73  Resp: Temp: 97.8 F (36.6 C)  97.8 F (36.6 C) 97.9 F (36.6 C)  TempSrc: Oral  Oral Oral  SpO2: 99%  97% 99%  Weight: 65.1 kg (143 lb 8 oz)  Constitutional: NAD, calm, comfortable, NONtoxic appearing Vitals:   09/20/16 1941 09/20/16 1944 09/20/16 2004 09/20/16 2226  BP: (!) 88/60 (!) 86/60 101/63 107/77  Pulse: 80  74 73  Resp: Temp: 97.8 F (36.6 C)  97.8 F (36.6 C) 97.9 F (36.6 C)  TempSrc: Oral  Oral Oral  SpO2: 99%  97% 99%  Weight: 65.1 kg (143 lb 8 oz)      Eyes: PERRL, lids and conjunctivae normal ENMT: Mucous membranes are moist. Posterior pharynx clear of any exudate or lesions. Normal dentition.  Neck: normal appearance, supple, no masses Respiratory: clear to auscultation bilaterally, no  wheezing, no crackles. Normal respiratory effort. No accessory muscle use.  Cardiovascular: Normal rate, regular rhythm, no murmurs / rubs / gallops. No extremity edema. 2+ pedal pulses.  GI: abdomen is soft and compressible.  No distention.  No tenderness.  Bowel sounds are present. Musculoskeletal:  No joint deformity in upper and lower extremities. Good ROM, no contractures. Normal muscle tone.  Skin: no rashes, warm and dry Neurologic: CN 2-12 grossly intact. Sensation intact, Strength symmetric bilaterally. Psychiatric: Normal judgment and insight. Alert and oriented x 3. Normal mood.     Labs on Admission: I have personally reviewed following labs and imaging studies  CBC:  Recent Labs Lab 09/20/16 2102  WBC 10.0  NEUTROABS 7.4  HGB 13.0  HCT 39.8  MCV 82.7  PLT 224   Basic Metabolic Panel:  Recent Labs Lab 09/20/16 2101  NA 134*  K 4.0  CL 103  CO2 22  GLUCOSE 357*  BUN 20  CREATININE 1.93*  CALCIUM 8.8*   GFR: CrCl cannot be calculated (Unknown ideal weight.).  Cardiac Enzymes:  Recent Labs Lab 09/20/16 2101  TROPONINI <0.03   CBG:  Recent Labs Lab 09/20/16 2117  GLUCAP 322*   Urine analysis:    Component Value Date/Time   COLORURINE YELLOW 09/20/2016 2101   APPEARANCEUR CLOUDY (A) 09/20/2016 2101   LABSPEC 1.010 09/20/2016 2101   PHURINE 5.0 09/20/2016 2101   GLUCOSEU 50 (A) 09/20/2016 2101   HGBUR NEGATIVE 09/20/2016 2101   BILIRUBINUR NEGATIVE 09/20/2016 2101   KETONESUR NEGATIVE 09/20/2016 2101   PROTEINUR 100 (A) 09/20/2016 2101   UROBILINOGEN 1.0 07/09/2010 0047   NITRITE NEGATIVE 09/20/2016 2101   LEUKOCYTESUR TRACE (A) 09/20/2016 2101    Radiological Exams on Admission: Ct Head Wo Contrast  Result Date: 09/20/2016 CLINICAL DATA:  Dizziness and near syncope.  Blurred vision. EXAM: CT HEAD WITHOUT CONTRAST TECHNIQUE: Contiguous axial images were obtained from the base of the skull through the vertex without intravenous contrast.  COMPARISON:  None. FINDINGS: Brain: No mass lesion, intraparenchymal hemorrhage or extra-axial collection. No evidence of acute cortical infarct. Brain parenchyma and CSF-containing spaces are normal for age. Bilateral basal ganglia mineralization. Vascular: No hyperdense vessel or unexpected calcification. Skull: Normal visualized skull base, calvarium and extracranial soft tissues. Sinuses/Orbits: No sinus fluid levels or advanced mucosal thickening. No mastoid effusion. Normal orbits. IMPRESSION: Normal head CT for age. Electronically Signed   By: Deatra Robinson M.D.   On: 09/20/2016 21:40    EKG: Pending  Assessment/Plan Principal Problem:   AKI (acute kidney injury) (HCC) Active Problems:   Hypotension   Diabetes (HCC)      AKI in the setting of relative hypotension, all likely driven by addition of lisinopril in the past month.  She may also have a small degree of dehydration in the setting of recent URI. --HOLD lisinopril --Hydrate  with NS --Urine does not appear infected --Will check orthostatics --Repeat BMP in the AM  Uncontrolled Type 2 Diabetes --Continue home dose of lisinopril --SSI AC/HS --Diet is inconsistent (she works nights and sleeps most of the day).  Will likely need short acting insulin and/or oral meds in addition to levemir for better long-term control. --Would benefit from diabetes education.  HLD --Statin  URI, likely some component of seasonal allergies with recent changes in weather --Low index of suspicion for bacterial process at this point --Claritin, guaifenesin prn  Of note, due for colonoscopy on Tuesday.  I advised patient to see PCP next week to make sure labs and BP are stable (and symptoms have resolved) after this admission.  Would anticipate re-scheduling colonoscopy after follow-up with PCP.      DVT prophylaxis: Low risk, early ambulation Code Status: FULL Family Communication: Daughter and sister present in the ED at the time of  admission. Disposition Plan: Expect she will go home tomorrow. Consults called: NONE Admission status: Place in observation with telemetry monitoring.   TIME SPENT: 50 minutes   Jerene Bears MD [facility] Pager [phone]  If 7PM-7AM, please contact night-coverage [email or web address] Password [email or web address]  09/21/2016, 1:06 AM

## 2021-07-11 ENCOUNTER — Emergency Department (HOSPITAL_COMMUNITY): Payer: BC Managed Care – PPO

## 2021-07-11 ENCOUNTER — Inpatient Hospital Stay (HOSPITAL_COMMUNITY)
Admission: EM | Admit: 2021-07-11 | Discharge: 2021-07-13 | DRG: 348 | Disposition: A | Discharge status: Home / Self Care | Payer: BC Managed Care – PPO | Attending: Internal Medicine | Admitting: Internal Medicine

## 2021-07-11 ENCOUNTER — Other Ambulatory Visit: Payer: Self-pay

## 2021-07-11 ENCOUNTER — Encounter (HOSPITAL_COMMUNITY): Payer: Self-pay | Admitting: *Deleted

## 2021-07-11 DIAGNOSIS — K610 Anal abscess: Secondary | ICD-10-CM | POA: Diagnosis not present

## 2021-07-11 DIAGNOSIS — L0231 Cutaneous abscess of buttock: Secondary | ICD-10-CM | POA: Diagnosis not present

## 2021-07-11 DIAGNOSIS — E1165 Type 2 diabetes mellitus with hyperglycemia: Secondary | ICD-10-CM | POA: Diagnosis present

## 2021-07-11 DIAGNOSIS — Z7982 Long term (current) use of aspirin: Secondary | ICD-10-CM

## 2021-07-11 DIAGNOSIS — F17200 Nicotine dependence, unspecified, uncomplicated: Secondary | ICD-10-CM | POA: Diagnosis present

## 2021-07-11 DIAGNOSIS — I10 Essential (primary) hypertension: Secondary | ICD-10-CM | POA: Diagnosis present

## 2021-07-11 DIAGNOSIS — Z720 Tobacco use: Secondary | ICD-10-CM | POA: Diagnosis present

## 2021-07-11 DIAGNOSIS — Z794 Long term (current) use of insulin: Secondary | ICD-10-CM

## 2021-07-11 DIAGNOSIS — R739 Hyperglycemia, unspecified: Secondary | ICD-10-CM | POA: Diagnosis present

## 2021-07-11 DIAGNOSIS — Z20822 Contact with and (suspected) exposure to covid-19: Secondary | ICD-10-CM | POA: Diagnosis present

## 2021-07-11 DIAGNOSIS — N179 Acute kidney failure, unspecified: Secondary | ICD-10-CM | POA: Diagnosis present

## 2021-07-11 DIAGNOSIS — I96 Gangrene, not elsewhere classified: Secondary | ICD-10-CM | POA: Diagnosis present

## 2021-07-11 DIAGNOSIS — L03317 Cellulitis of buttock: Secondary | ICD-10-CM | POA: Diagnosis present

## 2021-07-11 DIAGNOSIS — E119 Type 2 diabetes mellitus without complications: Secondary | ICD-10-CM

## 2021-07-11 DIAGNOSIS — L0291 Cutaneous abscess, unspecified: Secondary | ICD-10-CM

## 2021-07-11 LAB — CBC WITH DIFFERENTIAL/PLATELET
Abs Immature Granulocytes: 0.1 10*3/uL — ABNORMAL HIGH (ref 0.00–0.07)
Basophils Absolute: 0.1 10*3/uL (ref 0.0–0.1)
Basophils Relative: 1 %
Eosinophils Absolute: 0.2 10*3/uL (ref 0.0–0.5)
Eosinophils Relative: 2 %
HCT: 35 % — ABNORMAL LOW (ref 36.0–46.0)
Hemoglobin: 10.7 g/dL — ABNORMAL LOW (ref 12.0–15.0)
Immature Granulocytes: 1 %
Lymphocytes Relative: 15 %
Lymphs Abs: 1.9 10*3/uL (ref 0.7–4.0)
MCH: 25.9 pg — ABNORMAL LOW (ref 26.0–34.0)
MCHC: 30.6 g/dL (ref 30.0–36.0)
MCV: 84.7 fL (ref 80.0–100.0)
Monocytes Absolute: 0.9 10*3/uL (ref 0.1–1.0)
Monocytes Relative: 8 %
Neutro Abs: 8.9 10*3/uL — ABNORMAL HIGH (ref 1.7–7.7)
Neutrophils Relative %: 73 %
Platelets: 513 10*3/uL — ABNORMAL HIGH (ref 150–400)
RBC: 4.13 MIL/uL (ref 3.87–5.11)
RDW: 14.6 % (ref 11.5–15.5)
WBC: 12.1 10*3/uL — ABNORMAL HIGH (ref 4.0–10.5)
nRBC: 0.2 % (ref 0.0–0.2)

## 2021-07-11 LAB — COMPREHENSIVE METABOLIC PANEL
ALT: 11 U/L (ref 0–44)
AST: 12 U/L — ABNORMAL LOW (ref 15–41)
Albumin: 2.6 g/dL — ABNORMAL LOW (ref 3.5–5.0)
Alkaline Phosphatase: 130 U/L — ABNORMAL HIGH (ref 38–126)
Anion gap: 7 (ref 5–15)
BUN: 15 mg/dL (ref 8–23)
CO2: 20 mmol/L — ABNORMAL LOW (ref 22–32)
Calcium: 8.5 mg/dL — ABNORMAL LOW (ref 8.9–10.3)
Chloride: 106 mmol/L (ref 98–111)
Creatinine, Ser: 1.38 mg/dL — ABNORMAL HIGH (ref 0.44–1.00)
GFR, Estimated: 43 mL/min — ABNORMAL LOW (ref >60)
Glucose, Bld: 397 mg/dL — ABNORMAL HIGH (ref 70–99)
Potassium: 4.8 mmol/L (ref 3.5–5.1)
Sodium: 133 mmol/L — ABNORMAL LOW (ref 135–145)
Total Bilirubin: 0.5 mg/dL (ref 0.3–1.2)
Total Protein: 6.8 g/dL (ref 6.5–8.1)

## 2021-07-11 MED ORDER — MORPHINE SULFATE (PF) 4 MG/ML IV SOLN
4.0000 mg | Freq: Once | INTRAVENOUS | Status: AC
  Administered 2021-07-11: 23:00:00 4 mg via INTRAVENOUS
  Filled 2021-07-11: qty 1

## 2021-07-11 MED ORDER — VANCOMYCIN HCL IN DEXTROSE 1-5 GM/200ML-% IV SOLN
1000.0000 mg | Freq: Once | INTRAVENOUS | Status: AC
  Administered 2021-07-11: 1000 mg via INTRAVENOUS
  Filled 2021-07-11: qty 200

## 2021-07-11 MED ORDER — IOHEXOL 350 MG/ML SOLN
80.0000 mL | Freq: Once | INTRAVENOUS | Status: AC | PRN
  Administered 2021-07-11: 18:00:00 80 mL via INTRAVENOUS

## 2021-07-11 MED ORDER — SODIUM CHLORIDE 0.9 % IV BOLUS
1000.0000 mL | Freq: Once | INTRAVENOUS | Status: AC
  Administered 2021-07-11: 1000 mL via INTRAVENOUS

## 2021-07-11 MED ORDER — SODIUM CHLORIDE 0.9 % IV SOLN
2.0000 g | Freq: Once | INTRAVENOUS | Status: AC
  Administered 2021-07-11: 23:00:00 2 g via INTRAVENOUS
  Filled 2021-07-11: qty 2

## 2021-07-11 NOTE — ED Provider Triage Note (Signed)
Emergency Medicine Provider Triage Evaluation Note  PATTRICIA WEIHER , a 66 y.o. female  was evaluated in triage.  Pt complains of abscess to vulva/buttocks area since 1/14.  Patient was seen for this complaint previously urgent care given 10-day course of Bactrim as well as Rocephin shot in clinic.  Patient states that she has completed this course but that buttocks abscess continues.  Patient has history of poorly controlled diabetes.  Abscess is draining currently.  Review of Systems  Positive: Fevers, chills Negative: Nausea, vomiting, cp, sob  Physical Exam  BP 136/67 (BP Location: Right Arm)    Pulse 90    Temp 98.7 F (37.1 C) (Oral)    Resp 18    SpO2 95%  Gen:   Awake, no distress   Resp:  Normal effort  MSK:   Moves extremities without difficulty  Other:  Patient has erythematous abscess noted to right side of buttocks/vulva. Draining  Medical Decision Making  Medically screening exam initiated at 1:02 PM.  Appropriate orders placed.  Graylin Shiver was informed that the remainder of the evaluation will be completed by another provider, this initial triage assessment does not replace that evaluation, and the importance of remaining in the ED until their evaluation is complete.     Al Decant, PA-C 07/11/21 1305

## 2021-07-11 NOTE — ED Triage Notes (Signed)
Pt reports having an abscess for 3 weeks. Was seen at pcp on 1/14 for vulvar and buttock abscess and was treated with IM rocephin and 10 days of bactrim but reports no relief. Denies fever.

## 2021-07-11 NOTE — Consult Note (Signed)
Reason for Consult:gluteal abscess  °Referring Physician: Dr. Kommor ° °[name] is an 65 y.o. female.  °HPI: 65 Fwith h/o DM and R gluteal abscess x3weeks. ° °Pt was seen by PCP and given bactrim and rocephin.  Area had unimproved.  Pt with pain and presented for further eval. ° °Pt underwent CT which showed 5.5x5.6 cm R gluteal fluid collection.  Pt with leukocytosis and elev BS. ° °Pt given Abx per EDP. ° °I review the CT and Lab studies personally. ° °Past Medical History:  °Diagnosis Date  ° Diabetes mellitus without complication (HCC)   ° ° °History reviewed. No pertinent surgical history. ° °History reviewed. No pertinent family history. ° °Social History:  reports that she has been smoking. She has never used smokeless tobacco. She reports current alcohol use. She reports that she does not use drugs. ° °Allergies: No Known Allergies ° °Medications: I have reviewed the patient's current medications. ° °Results for orders placed or performed during the hospital encounter of 07/11/21 (from the past 48 hour(s))  °CBC with Differential     Status: Abnormal  ° Collection Time: 07/11/21  1:07 PM  °Result Value Ref Range  ° WBC 12.1 (H) 4.0 - 10.5 K/uL  ° RBC 4.13 3.87 - 5.11 MIL/uL  ° Hemoglobin 10.7 (L) 12.0 - 15.0 g/dL  ° HCT 35.0 (L) 36.0 - 46.0 %  ° MCV 84.7 80.0 - 100.0 fL  ° MCH 25.9 (L) 26.0 - 34.0 pg  ° MCHC 30.6 30.0 - 36.0 g/dL  ° RDW 14.6 11.5 - 15.5 %  ° Platelets 513 (H) 150 - 400 K/uL  ° nRBC 0.2 0.0 - 0.2 %  ° Neutrophils Relative % 73 %  ° Neutro Abs 8.9 (H) 1.7 - 7.7 K/uL  ° Lymphocytes Relative 15 %  ° Lymphs Abs 1.9 0.7 - 4.0 K/uL  ° Monocytes Relative 8 %  ° Monocytes Absolute 0.9 0.1 - 1.0 K/uL  ° Eosinophils Relative 2 %  ° Eosinophils Absolute 0.2 0.0 - 0.5 K/uL  ° Basophils Relative 1 %  ° Basophils Absolute 0.1 0.0 - 0.1 K/uL  ° Immature Granulocytes 1 %  ° Abs Immature Granulocytes 0.10 (H) 0.00 - 0.07 K/uL  °  Comment: Performed at [facility], [address]  [place], [place] [address]  °Comprehensive metabolic panel     Status: Abnormal  ° Collection Time: 07/11/21  1:07 PM  °Result Value Ref Range  ° Sodium 133 (L) 135 - 145 mmol/L  ° Potassium 4.8 3.5 - 5.1 mmol/L  ° Chloride 106 98 - 111 mmol/L  ° CO2 20 (L) 22 - 32 mmol/L  ° Glucose, Bld 397 (H) 70 - 99 mg/dL  °  Comment: Glucose reference range applies only to samples taken after fasting for at least 8 hours.  ° BUN 15 8 - 23 mg/dL  ° Creatinine, Ser 1.38 (H) 0.44 - 1.00 mg/dL  ° Calcium 8.5 (L) 8.9 - 10.3 mg/dL  ° Total Protein 6.8 6.5 - 8.1 g/dL  ° Albumin 2.6 (L) 3.5 - 5.0 g/dL  ° AST 12 (L) 15 - 41 U/L  ° ALT 11 0 - 44 U/L  ° Alkaline Phosphatase 130 (H) 38 - 126 U/L  ° Total Bilirubin 0.5 0.3 - 1.2 mg/dL  ° GFR, Estimated 43 (L) >60 mL/min  °  Comment: (NOTE) °Calculated using the CKD-EPI Creatinine Equation (2021) °  ° Anion gap 7 5 - 15  °  Comment: Performed   at [facility], [address], [place], [place] [address]    CT PELVIS W CONTRAST  Result Date: 07/11/2021 CLINICAL DATA:  Right sided buttock pain and abscess. EXAM: CT PELVIS WITH CONTRAST TECHNIQUE: Multidetector CT imaging of the pelvis was performed using the standard protocol following the bolus administration of intravenous contrast. RADIATION DOSE REDUCTION: This exam was performed according to the departmental dose-optimization program which includes automated exposure control, adjustment of the mA and/or kV according to patient size and/or use of iterative reconstruction technique. CONTRAST:  38mL OMNIPAQUE IOHEXOL 350 MG/ML SOLN COMPARISON:  None. FINDINGS: Lower Urinary Tract: Unremarkable urinary bladder. Bowel: No abnormality seen involving intrapelvic bowel loops. In the right posteromedial buttock there is a poorly defined fluid collection with early peripheral enhancement which measures approximately 5.7 x 5.6 cm. Surrounding edema is seen within the subcutaneous tissues of the right buttock. This is consistent with an phlegmon  or early abscess. There is also a linear low-attenuation tract extending anteriorly to the skin surface of the right posterior labia, and possibly the right lateral wall of the anus. This is consistent with a right buttock abscess, and is suspicious for a perianal fistula with abscess. Vascular/Lymphatic: No pathologically enlarged lymph nodes or other significant abnormality. Reproductive: No mass or other significant abnormality. Clips are seen from previous bilateral tubal ligation. Other: None. Musculoskeletal: No suspicious bone lesions identified. IMPRESSION: 5.7 cm ill-defined defined fluid collection in right posteromedial buttock subcutaneous tissues with surrounding inflammatory changes, and probable fistulous connection to the right lateral anal wall and perineum. 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 Nonemergent MRI could be obtained as an outpatient for treatment planning purposes if clinically warranted. Electronically Signed   By: Danae Orleans M.D.   On: 07/11/2021 18:22    Review of Systems  Constitutional:  Negative for chills and fever.  HENT:  Negative for ear discharge, hearing loss and sore throat.   Eyes:  Negative for discharge.  Respiratory:  Negative for cough and shortness of breath.   Cardiovascular:  Negative for chest pain and leg swelling.  Gastrointestinal:  Negative for abdominal pain, constipation, diarrhea, nausea and vomiting.  Musculoskeletal:  Negative for myalgias and neck pain.  Skin:  Negative for rash.  Allergic/Immunologic: Negative for environmental allergies.  Neurological:  Negative for dizziness and seizures.  Hematological:  Does not bruise/bleed easily.  Psychiatric/Behavioral:  Negative for suicidal ideas.   All other systems reviewed and are negative. Blood pressure 120/62, pulse 78, temperature 98.8 F (37.1 C), temperature source Oral, resp. rate 15, SpO2 96 %. Physical Exam Constitutional:      Appearance: She is  well-developed.     Comments: Conversant No acute distress  HENT:     Head: Normocephalic and atraumatic.  Eyes:     General: Lids are normal. No scleral icterus.    Pupils: Pupils are equal, round, and reactive to light.     Comments: Pupils are equal round and reactive No lid lag Moist conjunctiva  Neck:     Thyroid: No thyromegaly.     Trachea: No tracheal tenderness.     Comments: No cervical lymphadenopathy Cardiovascular:     Rate and Rhythm: Normal rate and regular rhythm.     Heart sounds: No murmur heard. Pulmonary:     Effort: Pulmonary effort is normal.     Breath sounds: Normal breath sounds. No wheezing or rales.  Abdominal:     Tenderness: There is no abdominal tenderness.  Hernia: No hernia is present.  Genitourinary:      Comments: Right gluteal abscess, ttp, erythematous Musculoskeletal:     Cervical back: Normal range of motion and neck supple.  Skin:    General: Skin is warm.     Findings: No rash.     Nails: There is no clubbing.     Comments: Normal skin turgor  Neurological:     Mental Status: She is alert and oriented to person, place, and time.     Comments: Normal gait and station  Psychiatric:        Mood and Affect: Mood normal.        Thought Content: Thought content normal.        Judgment: Judgment normal.     Comments: Appropriate affect    Assessment/Plan: 18 F with R gluteal abscess DM  Will need OR in AM for I&D by Dr. Dwain Sarna. Cont' IV abx  Axel Filler 07/11/2021, 11:07 PM

## 2021-07-11 NOTE — H&P (View-Only) (Signed)
Reason for Consult:gluteal abscess  Referring Physician: Dr. Azucena FreedKommor  [name] is an 66 y.o. female.  HPI: [age] Fwith h/o DM and R gluteal abscess x3weeks.  Pt was seen by PCP and given bactrim and rocephin.  Area had unimproved.  Pt with pain and presented for further eval.  Pt underwent CT which showed 5.5x5.6 cm R gluteal fluid collection.  Pt with leukocytosis and elev BS.  Pt given Abx per EDP.  I review the CT and Lab studies personally.  Past Medical History:  Diagnosis Date   Diabetes mellitus without complication (HCC)     History reviewed. No pertinent surgical history.  History reviewed. No pertinent family history.  Social History:  reports that she has been smoking. She has never used smokeless tobacco. She reports current alcohol use. She reports that she does not use drugs.  Allergies: No Known Allergies  Medications: I have reviewed the patient's current medications.  Results for orders placed or performed during the hospital encounter of 07/11/21 (from the past 48 hour(s))  CBC with Differential     Status: Abnormal   Collection Time: 07/11/21  1:07 PM  Result Value Ref Range   WBC 12.1 (H) 4.0 - 10.5 K/uL   RBC 4.13 3.87 - 5.11 MIL/uL   Hemoglobin 10.7 (L) 12.0 - 15.0 g/dL   HCT 40.935.0 (L) 81.136.0 - 91.446.0 %   MCV 84.7 80.0 - 100.0 fL   MCH 25.9 (L) 26.0 - 34.0 pg   MCHC 30.6 30.0 - 36.0 g/dL   RDW 78.214.6 95.611.5 - 21.315.5 %   Platelets 513 (H) 150 - 400 K/uL   nRBC 0.2 0.0 - 0.2 %   Neutrophils Relative % 73 %   Neutro Abs 8.9 (H) 1.7 - 7.7 K/uL   Lymphocytes Relative 15 %   Lymphs Abs 1.9 0.7 - 4.0 K/uL   Monocytes Relative 8 %   Monocytes Absolute 0.9 0.1 - 1.0 K/uL   Eosinophils Relative 2 %   Eosinophils Absolute 0.2 0.0 - 0.5 K/uL   Basophils Relative 1 %   Basophils Absolute 0.1 0.0 - 0.1 K/uL   Immature Granulocytes 1 %   Abs Immature Granulocytes 0.10 (H) 0.00 - 0.07 K/uL    Comment: Performed at [facility], [address]  [place], [place] [id number]  Comprehensive metabolic panel     Status: Abnormal   Collection Time: 07/11/21  1:07 PM  Result Value Ref Range   Sodium 133 (L) 135 - 145 mmol/L   Potassium 4.8 3.5 - 5.1 mmol/L   Chloride 106 98 - 111 mmol/L   CO2 20 (L) 22 - 32 mmol/L   Glucose, Bld 397 (H) 70 - 99 mg/dL    Comment: Glucose reference range applies only to samples taken after fasting for at least 8 hours.   BUN 15 8 - 23 mg/dL   Creatinine, Ser 8.461.38 (H) 0.44 - 1.00 mg/dL   Calcium 8.5 (L) 8.9 - 10.3 mg/dL   Total Protein 6.8 6.5 - 8.1 g/dL   Albumin 2.6 (L) 3.5 - 5.0 g/dL   AST 12 (L) 15 - 41 U/L   ALT 11 0 - 44 U/L   Alkaline Phosphatase 130 (H) 38 - 126 U/L   Total Bilirubin 0.5 0.3 - 1.2 mg/dL   GFR, Estimated 43 (L) >60 mL/min    Comment: (NOTE) Calculated using the CKD-EPI Creatinine Equation (2021)    Anion gap 7 5 - 15    Comment: Performed  at [facility], [address], [place], [place] [address]    CT PELVIS W CONTRAST  Result Date: 07/11/2021 CLINICAL DATA:  Right sided buttock pain and abscess. EXAM: CT PELVIS WITH CONTRAST TECHNIQUE: Multidetector CT imaging of the pelvis was performed using the standard protocol following the bolus administration of intravenous contrast. RADIATION DOSE REDUCTION: This exam was performed according to the departmental dose-optimization program which includes automated exposure control, adjustment of the mA and/or kV according to patient size and/or use of iterative reconstruction technique. CONTRAST:  38mL OMNIPAQUE IOHEXOL 350 MG/ML SOLN COMPARISON:  None. FINDINGS: Lower Urinary Tract: Unremarkable urinary bladder. Bowel: No abnormality seen involving intrapelvic bowel loops. In the right posteromedial buttock there is a poorly defined fluid collection with early peripheral enhancement which measures approximately 5.7 x 5.6 cm. Surrounding edema is seen within the subcutaneous tissues of the right buttock. This is consistent with an phlegmon  or early abscess. There is also a linear low-attenuation tract extending anteriorly to the skin surface of the right posterior labia, and possibly the right lateral wall of the anus. This is consistent with a right buttock abscess, and is suspicious for a perianal fistula with abscess. Vascular/Lymphatic: No pathologically enlarged lymph nodes or other significant abnormality. Reproductive: No mass or other significant abnormality. Clips are seen from previous bilateral tubal ligation. Other: None. Musculoskeletal: No suspicious bone lesions identified. IMPRESSION: 5.7 cm ill-defined defined fluid collection in right posteromedial buttock subcutaneous tissues with surrounding inflammatory changes, and probable fistulous connection to the right lateral anal wall and perineum. 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 Nonemergent MRI could be obtained as an outpatient for treatment planning purposes if clinically warranted. Electronically Signed   By: Danae Orleans M.D.   On: 07/11/2021 18:22    Review of Systems  Constitutional:  Negative for chills and fever.  HENT:  Negative for ear discharge, hearing loss and sore throat.   Eyes:  Negative for discharge.  Respiratory:  Negative for cough and shortness of breath.   Cardiovascular:  Negative for chest pain and leg swelling.  Gastrointestinal:  Negative for abdominal pain, constipation, diarrhea, nausea and vomiting.  Musculoskeletal:  Negative for myalgias and neck pain.  Skin:  Negative for rash.  Allergic/Immunologic: Negative for environmental allergies.  Neurological:  Negative for dizziness and seizures.  Hematological:  Does not bruise/bleed easily.  Psychiatric/Behavioral:  Negative for suicidal ideas.   All other systems reviewed and are negative. Blood pressure 120/62, pulse 78, temperature 98.8 F (37.1 C), temperature source Oral, resp. rate 15, SpO2 96 %. Physical Exam Constitutional:      Appearance: She is  well-developed.     Comments: Conversant No acute distress  HENT:     Head: Normocephalic and atraumatic.  Eyes:     General: Lids are normal. No scleral icterus.    Pupils: Pupils are equal, round, and reactive to light.     Comments: Pupils are equal round and reactive No lid lag Moist conjunctiva  Neck:     Thyroid: No thyromegaly.     Trachea: No tracheal tenderness.     Comments: No cervical lymphadenopathy Cardiovascular:     Rate and Rhythm: Normal rate and regular rhythm.     Heart sounds: No murmur heard. Pulmonary:     Effort: Pulmonary effort is normal.     Breath sounds: Normal breath sounds. No wheezing or rales.  Abdominal:     Tenderness: There is no abdominal tenderness.  Hernia: No hernia is present.  Genitourinary:      Comments: Right gluteal abscess, ttp, erythematous Musculoskeletal:     Cervical back: Normal range of motion and neck supple.  Skin:    General: Skin is warm.     Findings: No rash.     Nails: There is no clubbing.     Comments: Normal skin turgor  Neurological:     Mental Status: She is alert and oriented to person, place, and time.     Comments: Normal gait and station  Psychiatric:        Mood and Affect: Mood normal.        Thought Content: Thought content normal.        Judgment: Judgment normal.     Comments: Appropriate affect    Assessment/Plan: 18 F with R gluteal abscess DM  Will need OR in AM for I&D by Dr. Dwain Sarna. Cont' IV abx  Axel Filler 07/11/2021, 11:07 PM

## 2021-07-11 NOTE — ED Provider Notes (Signed)
[facility] EMERGENCY DEPARTMENT Provider Note   CSN: [id number] Arrival date & time: 07/11/21  1030     History  Chief Complaint  Patient presents with   Abscess    [name] is a 66 y.o. female with past medical history significant for hypertension, diabetes who presents with buttock abscess for last 3 weeks.  Seen at PCP on January 14 for vulvar and buttock abscess, treated with IM Rocephin and 10 days of Bactrim with no relief.  Has not had it drained before does report some feeling of fever, chills but not for the last couple of days.  Has had minimal abdominal pain.  Reports significant pain with bowel movements.  Significant purulent drainage.  Denies dysuria.  Has not taken anything for pain today.  Severe pain with any pressure on the site.   Abscess     Home Medications Prior to Admission medications   Medication Sig Start Date End Date Taking? Authorizing Provider  aspirin EC 81 MG tablet Take 81 mg by mouth daily.    [provider]  B Complex-Folic Acid (B COMPLEX-VITAMIN B12 PO) Take 1 tablet by mouth daily.    [provider]  insulin detemir (LEVEMIR) 100 UNIT/ML injection Inject 0.25 mLs (25 Units total) into the skin at bedtime. 09/21/16   Osvaldo Shipper, MD  Multiple Vitamins-Minerals (ALIVE ONCE DAILY WOMENS PO) Take 1 tablet by mouth daily.    [provider]  simvastatin (ZOCOR) 10 MG tablet Take 10 mg by mouth daily.    [provider]  tetrahydrozoline-zinc (EYE DROPS ALLERGY RELIEF) 0.05-0.25 % ophthalmic solution Place 2 drops into both eyes 3 (three) times daily as needed. Itching    [provider]      Allergies    Patient has no known allergies.    Review of Systems   Review of Systems  All other systems reviewed and are negative.  Physical Exam Updated Vital Signs BP 120/62 (BP Location: Right Arm)    Pulse 78    Temp 98.8 F (37.1 C) (Oral)    Resp 15    SpO2 96%  Physical  Exam Vitals and nursing note reviewed.  Constitutional:      General: She is not in acute distress.    Appearance: Normal appearance.  HENT:     Head: Normocephalic and atraumatic.  Eyes:     General:        Right eye: No discharge.        Left eye: No discharge.  Cardiovascular:     Rate and Rhythm: Normal rate and regular rhythm.     Heart sounds: No murmur heard.   No friction rub. No gallop.  Pulmonary:     Effort: Pulmonary effort is normal.     Breath sounds: Normal breath sounds.  Abdominal:     General: Bowel sounds are normal.     Palpations: Abdomen is soft.     Comments: No significant TTP abdomen. No rebound, rigidity, guarding. Normal bowel sounds throughout.  Skin:    General: Skin is warm and dry.     Capillary Refill: Capillary refill takes less than 2 seconds.     Comments: Patient with significant fluctuance, erythema, induration of the right hemi-buttock. Some purulent drainage noted from pore. Irritated rectum, no blood noted.  Neurological:     Mental Status: She is alert and oriented to person, place, and time.  Psychiatric:        Mood and  Affect: Mood normal.        Behavior: Behavior normal.    ED Results / Procedures / Treatments   Labs (all labs ordered are listed, but only abnormal results are displayed) Labs Reviewed  CBC WITH DIFFERENTIAL/PLATELET - Abnormal; Notable for the following components:      Result Value   WBC 12.1 (*)    Hemoglobin 10.7 (*)    HCT 35.0 (*)    MCH 25.9 (*)    Platelets 513 (*)    Neutro Abs 8.9 (*)    Abs Immature Granulocytes 0.10 (*)    All other components within normal limits  COMPREHENSIVE METABOLIC PANEL - Abnormal; Notable for the following components:   Sodium 133 (*)    CO2 20 (*)    Glucose, Bld 397 (*)    Creatinine, Ser 1.38 (*)    Calcium 8.5 (*)    Albumin 2.6 (*)    AST 12 (*)    Alkaline Phosphatase 130 (*)    GFR, Estimated 43 (*)    All other components within normal limits  RESP PANEL  BY RT-PCR (FLU A&B, COVID) [id number]    EKG None  Radiology CT PELVIS W CONTRAST  Result Date: 07/11/2021 CLINICAL DATA:  Right sided buttock pain and abscess. EXAM: CT PELVIS WITH CONTRAST TECHNIQUE: Multidetector CT imaging of the pelvis was performed using the standard protocol following the bolus administration of intravenous contrast. RADIATION DOSE REDUCTION: This exam was performed according to the departmental dose-optimization program which includes automated exposure control, adjustment of the mA and/or kV according to patient size and/or use of iterative reconstruction technique. CONTRAST:  80mL OMNIPAQUE IOHEXOL 350 MG/ML SOLN COMPARISON:  None. FINDINGS: Lower Urinary Tract: Unremarkable urinary bladder. Bowel: No abnormality seen involving intrapelvic bowel loops. In the right posteromedial buttock there is a poorly defined fluid collection with early peripheral enhancement which measures approximately 5.7 x 5.6 cm. Surrounding edema is seen within the subcutaneous tissues of the right buttock. This is consistent with an phlegmon or early abscess. There is also a linear low-attenuation tract extending anteriorly to the skin surface of the right posterior labia, and possibly the right lateral wall of the anus. This is consistent with a right buttock abscess, and is suspicious for a perianal fistula with abscess. Vascular/Lymphatic: No pathologically enlarged lymph nodes or other significant abnormality. Reproductive: No mass or other significant abnormality. Clips are seen from previous bilateral tubal ligation. Other: None. Musculoskeletal: No suspicious bone lesions identified. IMPRESSION: 5.7 cm ill-defined defined fluid collection in right posteromedial buttock subcutaneous tissues with surrounding inflammatory changes, and probable fistulous connection to the right lateral anal wall and perineum. 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 Nonemergent MRI could be  obtained as an outpatient for treatment planning purposes if clinically warranted. Electronically Signed   By: Danae OrleansJohn A Stahl M.D.   On: 07/11/2021 18:22    Procedures Procedures    Medications Ordered in ED Medications  sodium chloride 0.9 % bolus 1,000 mL (has no administration in time range)  vancomycin (VANCOCIN) IVPB 1000 mg/200 mL premix (has no administration in time range)  ceFEPIme (MAXIPIME) 2 g in sodium chloride 0.9 % 100 mL IVPB (has no administration in time range)  iohexol (OMNIPAQUE) 350 MG/ML injection 80 mL (80 mLs Intravenous Contrast Given 07/11/21 1805)    ED Course/ Medical Decision Making/ A&P  Medical Decision Making Risk Prescription drug management.  Patient with is a patient who presents with concern for an abscess on the right hemibuttock for the last 3 weeks.  No improvement despite treatment with IM Rocephin, 10 days of Bactrim.  Actively draining, significant pain, redness.  Patient reports some fever chills few days ago, not the last couple of days.  Significant pain with any contact to the site.  She with significant past medical history of diabetes, reports that her sugars have been somewhat elevated at home recently.  Additional history obtained from patient's friend.  Reviewed her previous hospital admissions, ED visits.  Physical Exam: Physical exam performed. The pertinent findings include: fluctuant, indurated abscess right buttocks, inflamed perineum. No TTP abdomen. Non-septic appearing patient.  Lab Tests: I Ordered, and personally interpreted labs.  The pertinent results include:  CMP with mild hyponatremia which is a pseudohyponatremia due to glucose of 397.  The corrected sodium is 140.  Significantly elevated blood glucose of 397.  No recent creatinine on file, however creatinine is 1.38 today, last known value was 0.96 4 years ago.  Patient with mildly elevated alkaline phosphatase.  CBC significant for leukocytosis,  white blood cells at 12.1, neutrophil predominance.  Mild anemia, hemoglobin 10.7.   Imaging Studies: I ordered imaging studies including CT pelvis with contrast. I independently visualized and interpreted imaging which showed 5.7 cm poorly defined fluid collection, concern for fistula formation with peritoneum, rectum. I agree with the radiologist interpretation.   Medications: I ordered medication including fluid bolus for hyperglycemia, cefepime, Vanco for cellulitis and fluid collection, as well as morphine for pain control.  Patient with significant improvement of her comfort after 4 mg morphine x1.  Consultations Obtained: I requested consultation with the general surgery team, spoke with Dr. Derrell Lolling who will come down and see the patient, make a decision for planning, direct admit to surgery versus admit to hospital service.  11:50 PM Care of [name] transferred to [facility] and Dr. Preston Fleeting at the end of my shift as the patient will require reassessment once labs/imaging have resulted. Patient presentation, ED course, and plan of care discussed with review of all pertinent labs and imaging. Please see his/her note for further details regarding further ED course and disposition. Plan at time of handoff is recommendations for Dr. Derrell Lolling, admit directly to surgery versus admit to hospital service for drainage of abscess on buttock with possible fistula. This may be altered or completely changed at the discretion of the oncoming team pending results of further workup.  Final Clinical Impression(s) / ED Diagnoses Final diagnoses:  None    Rx / DC Orders ED Discharge Orders     None         [place] 07/11/21 2351    Glendora Score, MD 07/12/21 2328

## 2021-07-12 ENCOUNTER — Encounter (HOSPITAL_COMMUNITY)
Admission: EM | Disposition: A | Discharge status: Home / Self Care | Payer: Self-pay | Source: Home / Self Care | Attending: Internal Medicine

## 2021-07-12 ENCOUNTER — Other Ambulatory Visit: Payer: Self-pay

## 2021-07-12 ENCOUNTER — Encounter (HOSPITAL_COMMUNITY): Payer: Self-pay | Admitting: Internal Medicine

## 2021-07-12 ENCOUNTER — Inpatient Hospital Stay (HOSPITAL_COMMUNITY): Payer: BC Managed Care – PPO | Admitting: Certified Registered Nurse Anesthetist

## 2021-07-12 DIAGNOSIS — Z7982 Long term (current) use of aspirin: Secondary | ICD-10-CM | POA: Diagnosis not present

## 2021-07-12 DIAGNOSIS — I96 Gangrene, not elsewhere classified: Secondary | ICD-10-CM | POA: Diagnosis present

## 2021-07-12 DIAGNOSIS — Z720 Tobacco use: Secondary | ICD-10-CM

## 2021-07-12 DIAGNOSIS — L0231 Cutaneous abscess of buttock: Secondary | ICD-10-CM

## 2021-07-12 DIAGNOSIS — F17200 Nicotine dependence, unspecified, uncomplicated: Secondary | ICD-10-CM | POA: Diagnosis present

## 2021-07-12 DIAGNOSIS — R739 Hyperglycemia, unspecified: Secondary | ICD-10-CM | POA: Diagnosis present

## 2021-07-12 DIAGNOSIS — E119 Type 2 diabetes mellitus without complications: Secondary | ICD-10-CM | POA: Diagnosis not present

## 2021-07-12 DIAGNOSIS — Z794 Long term (current) use of insulin: Secondary | ICD-10-CM

## 2021-07-12 DIAGNOSIS — L03317 Cellulitis of buttock: Secondary | ICD-10-CM | POA: Diagnosis present

## 2021-07-12 DIAGNOSIS — N179 Acute kidney failure, unspecified: Secondary | ICD-10-CM | POA: Diagnosis present

## 2021-07-12 DIAGNOSIS — Z20822 Contact with and (suspected) exposure to covid-19: Secondary | ICD-10-CM | POA: Diagnosis present

## 2021-07-12 DIAGNOSIS — E1165 Type 2 diabetes mellitus with hyperglycemia: Secondary | ICD-10-CM | POA: Diagnosis present

## 2021-07-12 DIAGNOSIS — I10 Essential (primary) hypertension: Secondary | ICD-10-CM | POA: Diagnosis present

## 2021-07-12 DIAGNOSIS — K610 Anal abscess: Secondary | ICD-10-CM | POA: Diagnosis present

## 2021-07-12 HISTORY — PX: INCISION AND DRAINAGE PERIRECTAL ABSCESS: SHX1804

## 2021-07-12 LAB — CBC WITH DIFFERENTIAL/PLATELET
Abs Immature Granulocytes: 0.07 10*3/uL (ref 0.00–0.07)
Basophils Absolute: 0.1 10*3/uL (ref 0.0–0.1)
Basophils Relative: 1 %
Eosinophils Absolute: 0.2 10*3/uL (ref 0.0–0.5)
Eosinophils Relative: 2 %
HCT: 31.7 % — ABNORMAL LOW (ref 36.0–46.0)
Hemoglobin: 9.9 g/dL — ABNORMAL LOW (ref 12.0–15.0)
Immature Granulocytes: 1 %
Lymphocytes Relative: 17 %
Lymphs Abs: 1.7 10*3/uL (ref 0.7–4.0)
MCH: 26.6 pg (ref 26.0–34.0)
MCHC: 31.2 g/dL (ref 30.0–36.0)
MCV: 85.2 fL (ref 80.0–100.0)
Monocytes Absolute: 1.2 10*3/uL — ABNORMAL HIGH (ref 0.1–1.0)
Monocytes Relative: 12 %
Neutro Abs: 7.2 10*3/uL (ref 1.7–7.7)
Neutrophils Relative %: 67 %
Platelets: 431 10*3/uL — ABNORMAL HIGH (ref 150–400)
RBC: 3.72 MIL/uL — ABNORMAL LOW (ref 3.87–5.11)
RDW: 14.6 % (ref 11.5–15.5)
WBC: 10.4 10*3/uL (ref 4.0–10.5)
nRBC: 0 % (ref 0.0–0.2)

## 2021-07-12 LAB — COMPREHENSIVE METABOLIC PANEL
ALT: 12 U/L (ref 0–44)
AST: 13 U/L — ABNORMAL LOW (ref 15–41)
Albumin: 2.4 g/dL — ABNORMAL LOW (ref 3.5–5.0)
Alkaline Phosphatase: 133 U/L — ABNORMAL HIGH (ref 38–126)
Anion gap: 9 (ref 5–15)
BUN: 16 mg/dL (ref 8–23)
CO2: 18 mmol/L — ABNORMAL LOW (ref 22–32)
Calcium: 8.1 mg/dL — ABNORMAL LOW (ref 8.9–10.3)
Chloride: 106 mmol/L (ref 98–111)
Creatinine, Ser: 1.16 mg/dL — ABNORMAL HIGH (ref 0.44–1.00)
GFR, Estimated: 53 mL/min — ABNORMAL LOW (ref >60)
Glucose, Bld: 338 mg/dL — ABNORMAL HIGH (ref 70–99)
Potassium: 5.2 mmol/L — ABNORMAL HIGH (ref 3.5–5.1)
Sodium: 133 mmol/L — ABNORMAL LOW (ref 135–145)
Total Bilirubin: 0.3 mg/dL (ref 0.3–1.2)
Total Protein: 6.3 g/dL — ABNORMAL LOW (ref 6.5–8.1)

## 2021-07-12 LAB — GLUCOSE, CAPILLARY
Glucose-Capillary: 110 mg/dL — ABNORMAL HIGH (ref 70–99)
Glucose-Capillary: 157 mg/dL — ABNORMAL HIGH (ref 70–99)
Glucose-Capillary: 301 mg/dL — ABNORMAL HIGH (ref 70–99)
Glucose-Capillary: 432 mg/dL — ABNORMAL HIGH (ref 70–99)

## 2021-07-12 LAB — RESP PANEL BY RT-PCR (FLU A&B, COVID) ARPGX2
Influenza A by PCR: NEGATIVE
Influenza B by PCR: NEGATIVE
SARS Coronavirus 2 by RT PCR: NEGATIVE

## 2021-07-12 LAB — CBG MONITORING, ED
Glucose-Capillary: 263 mg/dL — ABNORMAL HIGH (ref 70–99)
Glucose-Capillary: 182 mg/dL — ABNORMAL HIGH (ref 70–99)

## 2021-07-12 LAB — PROTIME-INR
INR: 1.1 (ref 0.8–1.2)
Prothrombin Time: 14.1 seconds (ref 11.4–15.2)

## 2021-07-12 LAB — MAGNESIUM: Magnesium: 1.5 mg/dL — ABNORMAL LOW (ref 1.7–2.4)

## 2021-07-12 LAB — HIV ANTIBODY (ROUTINE TESTING W REFLEX): HIV Screen 4th Generation wRfx: NONREACTIVE

## 2021-07-12 SURGERY — INCISION AND DRAINAGE, ABSCESS, PERIRECTAL
Anesthesia: General | Laterality: Right

## 2021-07-12 MED ORDER — ACETAMINOPHEN 650 MG RE SUPP: 650.0000 mg | Freq: Four times a day (QID) | RECTAL | Status: DC | PRN

## 2021-07-12 MED ORDER — HYDROCODONE-ACETAMINOPHEN 5-325 MG PO TABS
1.0000 | ORAL_TABLET | ORAL | Status: DC | PRN
  Administered 2021-07-12 – 2021-07-13 (×5): 2 via ORAL
  Filled 2021-07-12 (×5): qty 2

## 2021-07-12 MED ORDER — ONDANSETRON HCL 4 MG/2ML IJ SOLN
INTRAMUSCULAR | Status: DC | PRN
  Administered 2021-07-12: 4 mg via INTRAVENOUS

## 2021-07-12 MED ORDER — FENTANYL CITRATE (PF) 250 MCG/5ML IJ SOLN
INTRAMUSCULAR | Status: AC
  Filled 2021-07-12: qty 5

## 2021-07-12 MED ORDER — SODIUM CHLORIDE 0.9 % IV SOLN: INTRAVENOUS | Status: DC

## 2021-07-12 MED ORDER — CHLORHEXIDINE GLUCONATE 0.12 % MT SOLN
15.0000 mL | Freq: Once | OROMUCOSAL | Status: AC
  Administered 2021-07-12: 15 mL via OROMUCOSAL
  Filled 2021-07-12 (×2): qty 15

## 2021-07-12 MED ORDER — BUPIVACAINE HCL (PF) 0.25 % IJ SOLN
INTRAMUSCULAR | Status: AC
  Filled 2021-07-12: qty 30

## 2021-07-12 MED ORDER — LACTATED RINGERS IV SOLN: INTRAVENOUS | Status: DC

## 2021-07-12 MED ORDER — PROPOFOL 10 MG/ML IV BOLUS
INTRAVENOUS | Status: AC
  Filled 2021-07-12: qty 20

## 2021-07-12 MED ORDER — PHENYLEPHRINE 40 MCG/ML (10ML) SYRINGE FOR IV PUSH (FOR BLOOD PRESSURE SUPPORT)
PREFILLED_SYRINGE | INTRAVENOUS | Status: AC
  Filled 2021-07-12: qty 10

## 2021-07-12 MED ORDER — LIDOCAINE 2% (20 MG/ML) 5 ML SYRINGE
INTRAMUSCULAR | Status: DC | PRN
Start: 2021-07-12 — End: 2021-07-12
  Administered 2021-07-12: 100 mg via INTRAVENOUS

## 2021-07-12 MED ORDER — PROMETHAZINE HCL 25 MG/ML IJ SOLN: 6.2500 mg | INTRAMUSCULAR | Status: DC | PRN

## 2021-07-12 MED ORDER — BUPIVACAINE HCL (PF) 0.25 % IJ SOLN
INTRAMUSCULAR | Status: DC | PRN
  Administered 2021-07-12: 8 mL

## 2021-07-12 MED ORDER — FENTANYL CITRATE (PF) 250 MCG/5ML IJ SOLN
INTRAMUSCULAR | Status: DC | PRN
  Administered 2021-07-12: 100 ug via INTRAVENOUS
  Administered 2021-07-12 (×2): 25 ug via INTRAVENOUS

## 2021-07-12 MED ORDER — ACETAMINOPHEN 325 MG PO TABS: 650.0000 mg | ORAL_TABLET | Freq: Four times a day (QID) | ORAL | Status: DC | PRN

## 2021-07-12 MED ORDER — PHENYLEPHRINE 40 MCG/ML (10ML) SYRINGE FOR IV PUSH (FOR BLOOD PRESSURE SUPPORT)
PREFILLED_SYRINGE | INTRAVENOUS | Status: DC | PRN
Start: 2021-07-12 — End: 2021-07-12
  Administered 2021-07-12 (×2): 80 ug via INTRAVENOUS
  Administered 2021-07-12: 120 ug via INTRAVENOUS
  Administered 2021-07-12 (×3): 80 ug via INTRAVENOUS

## 2021-07-12 MED ORDER — NICOTINE 14 MG/24HR TD PT24: 14.0000 mg | MEDICATED_PATCH | Freq: Every day | TRANSDERMAL | Status: DC | PRN

## 2021-07-12 MED ORDER — VANCOMYCIN HCL IN DEXTROSE 1-5 GM/200ML-% IV SOLN
1000.0000 mg | INTRAVENOUS | Status: DC
  Administered 2021-07-12: 1000 mg via INTRAVENOUS
  Filled 2021-07-12 (×2): qty 200

## 2021-07-12 MED ORDER — FENTANYL CITRATE (PF) 100 MCG/2ML IJ SOLN
25.0000 ug | INTRAMUSCULAR | Status: DC | PRN
  Administered 2021-07-12: 25 ug via INTRAVENOUS

## 2021-07-12 MED ORDER — MIDAZOLAM HCL 2 MG/2ML IJ SOLN
INTRAMUSCULAR | Status: DC | PRN
  Administered 2021-07-12: 2 mg via INTRAVENOUS

## 2021-07-12 MED ORDER — CELECOXIB 200 MG PO CAPS
200.0000 mg | ORAL_CAPSULE | Freq: Once | ORAL | Status: AC
Start: 2021-07-12 — End: 2021-07-12
  Administered 2021-07-12: 200 mg via ORAL
  Filled 2021-07-12 (×2): qty 1

## 2021-07-12 MED ORDER — MIDAZOLAM HCL 2 MG/2ML IJ SOLN
INTRAMUSCULAR | Status: AC
  Filled 2021-07-12: qty 2

## 2021-07-12 MED ORDER — ACETAMINOPHEN 500 MG PO TABS
1000.0000 mg | ORAL_TABLET | Freq: Once | ORAL | Status: AC
Start: 2021-07-12 — End: 2021-07-12
  Administered 2021-07-12: 1000 mg via ORAL
  Filled 2021-07-12: qty 2

## 2021-07-12 MED ORDER — FENTANYL CITRATE (PF) 100 MCG/2ML IJ SOLN
INTRAMUSCULAR | Status: AC
  Filled 2021-07-12: qty 2

## 2021-07-12 MED ORDER — LACTATED RINGERS IV SOLN: INTRAVENOUS | Status: DC | PRN

## 2021-07-12 MED ORDER — ORAL CARE MOUTH RINSE: 15.0000 mL | Freq: Once | OROMUCOSAL | Status: AC

## 2021-07-12 MED ORDER — PROPOFOL 10 MG/ML IV BOLUS
INTRAVENOUS | Status: DC | PRN
  Administered 2021-07-12: 30 mg via INTRAVENOUS
  Administered 2021-07-12: 150 mg via INTRAVENOUS

## 2021-07-12 MED ORDER — AMISULPRIDE (ANTIEMETIC) 5 MG/2ML IV SOLN: 10.0000 mg | Freq: Once | INTRAVENOUS | Status: DC | PRN

## 2021-07-12 MED ORDER — NALOXONE HCL 0.4 MG/ML IJ SOLN: 0.4000 mg | INTRAMUSCULAR | Status: DC | PRN

## 2021-07-12 MED ORDER — FENTANYL CITRATE PF 50 MCG/ML IJ SOSY
50.0000 ug | PREFILLED_SYRINGE | Freq: Once | INTRAMUSCULAR | Status: AC
  Administered 2021-07-12: 50 ug via INTRAVENOUS
  Filled 2021-07-12: qty 1

## 2021-07-12 MED ORDER — ONDANSETRON HCL 4 MG/2ML IJ SOLN: 4.0000 mg | Freq: Four times a day (QID) | INTRAMUSCULAR | Status: DC | PRN

## 2021-07-12 MED ORDER — SUCCINYLCHOLINE CHLORIDE 200 MG/10ML IV SOSY
PREFILLED_SYRINGE | INTRAVENOUS | Status: DC | PRN
Start: 2021-07-12 — End: 2021-07-12
  Administered 2021-07-12: 120 mg via INTRAVENOUS

## 2021-07-12 MED ORDER — INSULIN ASPART 100 UNIT/ML IJ SOLN
11.0000 [IU] | Freq: Once | INTRAMUSCULAR | Status: AC
  Administered 2021-07-12: 11 [IU] via SUBCUTANEOUS

## 2021-07-12 MED ORDER — LIDOCAINE 2% (20 MG/ML) 5 ML SYRINGE
INTRAMUSCULAR | Status: AC
  Filled 2021-07-12: qty 5

## 2021-07-12 MED ORDER — INSULIN DETEMIR 100 UNIT/ML ~~LOC~~ SOLN
10.0000 [IU] | Freq: Every day | SUBCUTANEOUS | Status: DC
  Administered 2021-07-12: 10 [IU] via SUBCUTANEOUS
  Filled 2021-07-12 (×4): qty 0.1

## 2021-07-12 MED ORDER — SODIUM CHLORIDE 0.9 % IV SOLN
1.0000 g | INTRAVENOUS | Status: DC
  Administered 2021-07-12 – 2021-07-13 (×2): 1 g via INTRAVENOUS
  Filled 2021-07-12 (×2): qty 10

## 2021-07-12 MED ORDER — DEXAMETHASONE SODIUM PHOSPHATE 10 MG/ML IJ SOLN
INTRAMUSCULAR | Status: DC | PRN
  Administered 2021-07-12: 4 mg via INTRAVENOUS

## 2021-07-12 MED ORDER — INSULIN ASPART 100 UNIT/ML IJ SOLN
0.0000 [IU] | Freq: Four times a day (QID) | INTRAMUSCULAR | Status: DC
  Administered 2021-07-12: 5 [IU] via SUBCUTANEOUS
  Administered 2021-07-12: 7 [IU] via SUBCUTANEOUS
  Administered 2021-07-13: 2 [IU] via SUBCUTANEOUS
  Administered 2021-07-13: 5 [IU] via SUBCUTANEOUS

## 2021-07-12 MED ORDER — FENTANYL CITRATE PF 50 MCG/ML IJ SOSY
50.0000 ug | PREFILLED_SYRINGE | INTRAMUSCULAR | Status: DC | PRN
  Administered 2021-07-12: 50 ug via INTRAVENOUS
  Filled 2021-07-12: qty 1

## 2021-07-12 SURGICAL SUPPLY — 29 items
BAG COUNTER SPONGE SURGICOUNT (BAG) ×2 IMPLANT
BAG SPNG CNTER NS LX DISP (BAG) ×1
BRIEF STRETCH FOR OB PAD LRG (UNDERPADS AND DIAPERS) ×2 IMPLANT
CANISTER SUCT 3000ML PPV (MISCELLANEOUS) ×2 IMPLANT
COVER SURGICAL LIGHT HANDLE (MISCELLANEOUS) ×2 IMPLANT
DRAPE LAPAROTOMY 100X72 PEDS (DRAPES) ×1 IMPLANT
DRSG PAD ABDOMINAL 8X10 ST (GAUZE/BANDAGES/DRESSINGS) ×2 IMPLANT
ELECT CAUTERY BLADE 6.4 (BLADE) ×2 IMPLANT
ELECT REM PT RETURN 9FT ADLT (ELECTROSURGICAL) ×2
ELECTRODE REM PT RTRN 9FT ADLT (ELECTROSURGICAL) IMPLANT
GAUZE PACKING IODOFORM 1X5 (PACKING) ×1 IMPLANT
GAUZE SPONGE 4X4 12PLY STRL (GAUZE/BANDAGES/DRESSINGS) ×2 IMPLANT
GLOVE SURG ENC MOIS LTX SZ7 (GLOVE) ×2 IMPLANT
GLOVE SURG UNDER POLY LF SZ7.5 (GLOVE) ×2 IMPLANT
GOWN STRL REUS W/ TWL LRG LVL3 (GOWN DISPOSABLE) ×2 IMPLANT
GOWN STRL REUS W/TWL LRG LVL3 (GOWN DISPOSABLE) ×4
KIT BASIN OR (CUSTOM PROCEDURE TRAY) ×2 IMPLANT
KIT TURNOVER KIT B (KITS) ×2 IMPLANT
NDL HYPO 25GX1X1/2 BEV (NEEDLE) ×1 IMPLANT
NEEDLE HYPO 25GX1X1/2 BEV (NEEDLE) ×2 IMPLANT
NS IRRIG 1000ML POUR BTL (IV SOLUTION) ×2 IMPLANT
PACK GENERAL/GYN (CUSTOM PROCEDURE TRAY) ×1 IMPLANT
PAD ARMBOARD 7.5X6 YLW CONV (MISCELLANEOUS) ×2 IMPLANT
SWAB COLLECTION DEVICE MRSA (MISCELLANEOUS) ×1 IMPLANT
SWAB CULTURE ESWAB REG 1ML (MISCELLANEOUS) ×1 IMPLANT
SYR CONTROL 10ML LL (SYRINGE) ×2 IMPLANT
TOWEL GREEN STERILE (TOWEL DISPOSABLE) ×2 IMPLANT
TOWEL GREEN STERILE FF (TOWEL DISPOSABLE) ×2 IMPLANT
UNDERPAD 30X36 HEAVY ABSORB (UNDERPADS AND DIAPERS) ×2 IMPLANT

## 2021-07-12 NOTE — ED Notes (Signed)
Spoke with provider dr Arlean Hopping regarding [facility] order after antibiotics were given. States hold blood cultures and continue with antibiotics.

## 2021-07-12 NOTE — ED Provider Notes (Signed)
23:50: Assumed care of patient @ change of shift pending disposition.   See prior provider note for full H&P.  Briefly patient is a 66 yo female who presented to the ED with complaint of persistent abscess S/p outpatient abx, CT with 5.7 cm ill-defined defined fluid collection in right posteromedial buttock subcutaneous tissues with surrounding inflammatory changes, and probable fistulous connection to the right lateral anal wall and perineum. 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   Seen by general surgeon Dr. Derrell Lolling- plan for OR in the AM. Will admit to medicine.   01:48: CONSULT: Discussed with hospitalist Dr. Arlean Hopping- accepts admission.    Desmond Lope 07/12/21 0158    Dione Booze, MD 07/12/21 [id number]

## 2021-07-12 NOTE — H&P (Signed)
History and Physical    PLEASE NOTE THAT DRAGON DICTATION SOFTWARE WAS USED IN THE CONSTRUCTION OF THIS NOTE.   [name]:[id number] DOB: Aug 28, 1955 DOA: 07/11/2021  PCP: Patient, No Pcp Per (Inactive) (we will further assess) Patient coming from: home   I have personally briefly reviewed patient's old medical records in [place]  Chief Complaint: Pain associated with right buttock  HPI: [name] is a 66 y.o. female with medical history significant for type 2 diabetes mellitus who is admitted to [facility] on 07/11/2021 with abscess associated with right buttock after presenting from home to [facility] ED complaining of pain/erythema associated with right buttock.   The patient reports approximately 2 weeks of pain associated with her right buttock, which has been associated with increased erythema, swelling, tenderness.  She denies any overtly associated drainage, including no known purulent drainage.  Denies rash in any other site.  Denies any associated subjective fever, chills, rigors, or generalized myalgias.  No known breaks in skin integrity or any preceding trauma.   Denies any recent headache, neck stiffness, rhinitis, rhinorrhea, sore throat, sob, wheezing, cough, nausea, vomiting, abdominal pain, diarrhea. Denies dysuria, gross hematuria, or change in urinary urgency/frequency.  Denies any recent chest pain, diaphoresis, or palpitations.  She reports that she was prescribed Bactrim by her PCP for a suspected right buttocks abscess, and that after completing an approximately 1 week course of this antibiotic, noted no significant improvement in the above symptoms, prompting her to present to [facility] emergency department this evening for further evaluation/management thereof.  Medical history notable for a history of type 2 diabetes mellitus, with, per chart review, most recent hemoglobin A1c noted to be 9.0% in January 2012.  Outpatient insulin regimen appears  limited to basal insulin, noted to be on Levemir 25 units subcu nightly.  She is on a daily baby aspirin as an outpatient, but no additional antiplatelet anticoagulant medications.      ED Course:  Vital signs in the ED were notable for the following: Afebrile; heart rate 78-90, respiratory 15-18, blood pressure 119/76 - 143/71; oxygen saturation 95 to 90% on room air.  Labs were notable for the following: CMP notable for the following: Sodium 133, which corrects to proximately 138 when taking into account concomitant hyperglycemia, potassium 4.8, bicarbonate 20, anion gap 7, creatinine 1.38 compared to most recent prior serum creatinine data point 0.96 in April 2018, glucose 397.  CBC notable for white blood cell count 12,100.  COVID-19/influenza PCR were found to be negative.  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  EDP discussed patient's case/imaging with the on-call general surgeon, Dr. Rosendo Gros, Who recommended admission to the hospital service for further evaluation and management of right buttock abscess, with plan for general surgery to formally consult.  Additionally, Dr. Rosendo Gros [place] anticipated plan for the patient to be taken to the OR in the morning for surgical I&D of right buttocks abscess.   While in the ED, the following were administered: Morphine 4 mg IV x1, cefepime, IV vancomycin, normal saline x1 L bolus.    Subsequently, the patient was admitted for further evaluation and management of right buttocks abscess  associated with mild cellulitis and probable fistula.      Review of Systems: As per HPI otherwise 10  point review of systems negative.   Past Medical History:  Diagnosis Date   Diabetes mellitus without complication ([place])     History reviewed. No pertinent surgical history.  Social History:  reports that she has been smoking. She has never used smokeless tobacco. She reports current alcohol use. She reports that she does not use drugs.   No Known Allergies  History reviewed. No pertinent family history.   Prior to Admission medications   Medication Sig Start Date End Date Taking? Authorizing Provider  aspirin EC 81 MG tablet Take 81 mg by mouth daily.    [provider]  B Complex-Folic Acid (B COMPLEX-VITAMIN B12 PO) Take 1 tablet by mouth daily.    [provider]  insulin detemir (LEVEMIR) 100 UNIT/ML injection Inject 0.25 mLs (25 Units total) into the skin at bedtime. 09/21/16   Bonnielee Haff, MD  Multiple Vitamins-Minerals (ALIVE ONCE DAILY WOMENS PO) Take 1 tablet by mouth daily.    [provider]  simvastatin (ZOCOR) 10 MG tablet Take 10 mg by mouth daily.    [provider]  tetrahydrozoline-zinc (EYE DROPS ALLERGY RELIEF) 0.05-0.25 % ophthalmic solution Place 2 drops into both eyes 3 (three) times daily as needed. Itching    [provider]     Objective    Physical Exam: Vitals:   07/12/21 0045 07/12/21 0057 07/12/21 0130 07/12/21 0200  BP: (!) 125/57 (!) 125/59 119/76 129/66  Pulse: 82 82 82 84  Resp:  17    Temp:      TempSrc:      SpO2: (!) 88% 98% 93% 99%    General: appears to be stated age; alert, oriented Skin: warm, dry; erythema a/w right buttocks Head:  AT/[place]:  Oral mucosa membranes appear dry, normal dentition Neck: supple; trachea midline Heart:  RRR; did not appreciate any M/R/G Lungs: CTAB, did not appreciate any wheezes, rales, or rhonchi Abdomen: + BS; soft, ND, NT Vascular: 2+ pedal pulses b/l; 2+ radial pulses b/l Extremities: no peripheral edema, no muscle wasting Neuro: strength and  sensation intact in upper and lower extremities b/l   Labs on Admission: I have personally reviewed following labs and imaging studies  CBC: Recent Labs  Lab 07/11/21 1307  WBC 12.1*  NEUTROABS 8.9*  HGB 10.7*  HCT 35.0*  MCV 84.7  PLT 425*   Basic Metabolic Panel: Recent Labs  Lab 07/11/21 1307  NA 133*  K 4.8  CL 106  CO2 20*  GLUCOSE 397*  BUN 15  CREATININE 1.38*  CALCIUM 8.5*   GFR: CrCl cannot be calculated (Unknown ideal weight.). Liver Function Tests: Recent Labs  Lab 07/11/21 1307  AST 12*  ALT 11  ALKPHOS 130*  BILITOT 0.5  PROT 6.8  ALBUMIN 2.6*   No results for input(s): LIPASE, AMYLASE in the last 168 hours. No results for input(s): AMMONIA in the last 168 hours. Coagulation Profile: No results for input(s): INR, PROTIME in the last 168 hours. Cardiac Enzymes: No results for input(s): CKTOTAL, CKMB, CKMBINDEX, TROPONINI in the last 168 hours. BNP (last 3 results) No results for input(s): PROBNP in the last 8760 hours. HbA1C: No results for input(s): HGBA1C in the last 72 hours. CBG: No results for input(s): GLUCAP in the last 168 hours. Lipid Profile: No results for input(s): CHOL, HDL, LDLCALC, TRIG, CHOLHDL, LDLDIRECT in the last 72 hours.  Thyroid Function Tests: No results for input(s): TSH, T4TOTAL, FREET4, T3FREE, THYROIDAB in the last 72 hours. Anemia Panel: No results for input(s): VITAMINB12, FOLATE, FERRITIN, TIBC, IRON, RETICCTPCT in the last 72 hours. Urine analysis:    Component Value Date/Time   COLORURINE YELLOW 09/20/2016 2101   APPEARANCEUR CLOUDY (A) 09/20/2016 2101   LABSPEC 1.010 09/20/2016 2101   PHURINE 5.0 09/20/2016 2101   GLUCOSEU 50 (A) 09/20/2016 2101   HGBUR NEGATIVE 09/20/2016 2101   BILIRUBINUR NEGATIVE 09/20/2016 2101   KETONESUR NEGATIVE 09/20/2016 2101   PROTEINUR 100 (A) 09/20/2016 2101   UROBILINOGEN 1.0 07/09/2010 0047   NITRITE NEGATIVE 09/20/2016 2101   LEUKOCYTESUR TRACE (A) 09/20/2016 2101     Radiological Exams on Admission: CT PELVIS W CONTRAST  Result Date: 07/11/2021 CLINICAL DATA:  Right sided buttock pain and abscess. EXAM: CT PELVIS WITH CONTRAST TECHNIQUE: Multidetector CT imaging of the pelvis was performed using the standard protocol following the bolus administration of intravenous contrast. RADIATION DOSE REDUCTION: This exam was performed according to the departmental dose-optimization program which includes automated exposure control, adjustment of the mA and/or kV according to patient size and/or use of iterative reconstruction technique. CONTRAST:  85m OMNIPAQUE IOHEXOL 350 MG/ML SOLN COMPARISON:  None. FINDINGS: Lower Urinary Tract: Unremarkable urinary bladder. Bowel: No abnormality seen involving intrapelvic bowel loops. In the right posteromedial buttock there is a poorly defined fluid collection with early peripheral enhancement which measures approximately 5.7 x 5.6 cm. Surrounding edema is seen within the subcutaneous tissues of the right buttock. This is consistent with an phlegmon or early abscess. There is also a linear low-attenuation tract extending anteriorly to the skin surface of the right posterior labia, and possibly the right lateral wall of the anus. This is consistent with a right buttock abscess, and is suspicious for a perianal fistula with abscess. Vascular/Lymphatic: No pathologically enlarged lymph nodes or other significant abnormality. Reproductive: No mass or other significant abnormality. Clips are seen from previous bilateral tubal ligation. Other: None. Musculoskeletal: No suspicious bone lesions identified. IMPRESSION: 5.7 cm ill-defined defined fluid collection in right posteromedial buttock subcutaneous tissues with surrounding inflammatory changes, and probable fistulous connection to the right lateral anal wall and perineum. 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 Nonemergent MRI could be obtained as an outpatient  for treatment planning purposes if clinically warranted. Electronically Signed   By: JMarlaine HindM.D.   On: 07/11/2021 18:22      Assessment/Plan   Principal Problem:   Abscess of buttock Active Problems:   AKI (acute kidney injury) ([place]   Diabetes ([place]   Hyperglycemia   Tobacco abuse     #)right buttock abscess: Diagnosis on the basis of 2 weeks of progressive right buttock erythema, tenderness, swelling, with CT pelvis this evening demonstrating evidence of 5.7 cm fluid collection in the right posterior medial buttock with surrounding inflammatory changes and probable fistulous connection, overall suspicious for right perianal abscess with fistula in the absence of any subcutaneous gas or crepitus to suggest necrotizing fasciitis.  Case/imaging of been discussed with on-call general surgery, who will formally consult, and plan to take patient to the OR in the morning for surgical I&D.  Of note, mild leukocytosis is the solumedrol SIRS criteria met at this time.  Consequently, criteria for sepsis not currently met.  No evidence of hypotension.  Started on IV vancomycin and cefepime in the ED this evening.  In the setting of abscess, will continue IV vancomycin, but de-escalate  cephalosporin to third-generation in the form of Rocephin.  May consider adding anaerobic coverage given the location of this abscess.  The above symptoms progressed in spite of good compliance with outpatient course of Bactrim, potentially underscoring need for source control via operative I&D via general surgery.  Plan: General surgery formally consulted, plan for OR in the morning.  N.p.o.  Keppra cultures x2.  Continue IV vancomycin.  Start Rocephin.  Repeat CBC with differential in the morning.  Preop EKG ordered.  Check INR.  Check UA.  Prn IV fentanyl.       #) Acute Kidney Injury: Presenting with serum creatinine 1.38, relative to most recent prior value of 0.96 in April 2018.  While specific chronicity  not entirely clear at this time, given almost 5-year gap in data points, suspect that this represents AKI given multiple likely precipitating factors for such in the form of suspect  prerenal factors, notably presenting infection in the form of robotic abscess as well as relative dehydration due to auto diuresis as a consequence of presenting hyperglycemia.    Plan: monitor strict I's & O's and daily weights. Attempt to avoid nephrotoxic agents. Refrain from NSAIDs. Repeat BMP in the morning. Check serum magnesium level.  Check urinalysis with microscopy add-on random urine sodium and random urine creatinine.  NS at 100 cc/h.  Further evaluation management of right buttocks abscess as well as hyperglycemia in setting of DM2, as further detailed below.        #) Type 2 Diabetes Mellitus: documented history of such. Home insulin regimen: Levemir 25 units subcu nightly in the absence of any short acting insulin. Home oral hypoglycemic agents: None. presenting blood sugar: 397, but in the absence of anion gap metabolic acidosis to suggest DKA.  While the patient has a history of poorly controlled diabetes, with most recent hemoglobin A1c of 9.0% when checked greater than 10 years ago, there may also be a more acute contribution to her hyperglycemia as a consequence of physiologic stress relating to her presenting acute infection in the form of right buttocks abscess, as further detailed above.  In the setting of current n.p.o. status in anticipation of operative I&D of right buttocks abscess, will resume a portion of her home basal insulin for now, as further quantified below.     Plan: In the setting of current n.p.o. status, accuchecks every 6 hours with low dose SSI.  Resume a component of basal insulin in the form of Levemir 10 units subcu nightly, with first dose now.  IV fluids, as above.  Monitor strict I's and O's and daily weights.  Repeat BMP in the morning.       #) Chronic tobacco  abuse: Patient knowledges that she is current smoker.    Plan: Counseled patient importance of complete smoking discontinuation.  Prn nicotine patch ordered for use during this hospitalization.      DVT prophylaxis: SCD's   Code Status: Full code Family Communication: none Disposition Plan: Per Rounding Team Consults called: Dr. Rosendo Gros of General surgery has been consulted, as further detailed above;  Admission status: Inpatient; med telemetry  Warrants inpatient status on basis of need for further evaluation and management of right buttocks abscess, including requirement of operative I&D per associated general surgery consultation as well as need for IV antibiotics given failure of oral antibiotics, in the setting of progression of the above symptoms in spite of compliance/completion of outpatient regimen involving Bactrim.   PLEASE NOTE THAT DRAGON DICTATION SOFTWARE WAS  USED IN THE CONSTRUCTION OF THIS NOTE.   [place] DO [facility]  From [place]   07/12/2021, 2:23 AM

## 2021-07-12 NOTE — Discharge Instructions (Addendum)
Home Instructions Following Incision and Drainage of Perirectal/Perianal Abscess  Wound care - A dressing has been applied to control any bleeding or drainage immediately after your procedure.  You may remove this dressing at your first bowel movement or tomorrow morning, whichever comes first.  There may be packing inside your wound as well that should be removed with the dressing.  You do not need to repack the area.  After the dressing is removed, clean the area gently with a mild soap and warm water and place a piece of 100% cotton over the area.  Change to cotton every 1-3 hours while awake to keep the area clean and dry.   - Beginning tomorrow, sit in a tub of warm water for 15-20 minutes at least twice a day and after bowel movements (Sitz baths).  This will help with healing, pain and discomfort. - A small amount of bleeding is to be expected.  If you notice an increase in the bleeding, place a large piece of cotton (about the size of a golf ball) next to the anal opening and sit on a hard surface for 15 minutes.  If the bleeding persists or if you are concerned, please call the office.  Do not sit on rubber rings.  Instead, sit on a soft pillow.    Diet -Eat a regular diet.  Avoid foods that may constipate you or give you diarrhea.  Drink 6-8 glasses of water a day and avoid seeds, nuts and popcorn until the area heals.  Medication -Take pain medication as directed.  Do not drive or operate machinery if you are taking a prescription pain medication.   - We recommend Extra Strength Tylenol for mild to moderate pain.  This can be taken as instructed on the bottle.   - If you are given a prescription for antibiotics, take as instructed by your doctor until the entire course is completed  Bowel Habits Avoid laxatives unless instructed by your doctor. Take a fiber supplement twice a day (Metamucil, FiberCon, Benefiber) Avoid excessive straining to have a bowel movement Do not go for more than  3 days without a bowel movement.  Take a regular Fleet enema if you are constipated.  Call the office if unable to do this or no results.    Activity Resume activities as tolerated beginning tomorrow.  Avoid strenuous activities or sports for one week.    Call the office if you have any questions.  Call IMMEDIATELY if you should develop persistent heavy rectal bleeding, increase in pain, difficulty urinating or fever greater than 100 F.

## 2021-07-12 NOTE — Anesthesia Procedure Notes (Addendum)
Procedure Name: Intubation Date/Time: 07/12/2021 9:18 AM Performed by: Nils Pyle, CRNA Pre-anesthesia Checklist: Patient identified, Emergency Drugs available, Suction available, Patient being monitored and Timeout performed Patient Re-evaluated:Patient Re-evaluated prior to induction Oxygen Delivery Method: Circle system utilized Preoxygenation: Pre-oxygenation with 100% oxygen Induction Type: IV induction Ventilation: Mask ventilation without difficulty Laryngoscope Size: Miller and 2 Grade View: Grade II Tube type: Oral Tube size: 7.0 mm Number of attempts: 1 Airway Equipment and Method: Stylet and Oral airway Placement Confirmation: ETT inserted through vocal cords under direct vision, positive ETCO2 and breath sounds checked- equal and bilateral Secured at: 20 cm Tube secured with: Tape Dental Injury: Teeth and Oropharynx as per pre-operative assessment  Comments: Intubation by [facility]

## 2021-07-12 NOTE — Progress Notes (Signed)
°  DAY ZERO NOTE   Brief Narrative:  See H&P earlier this morning for further details.  Briefly patient is a 66 year old female who presents with worsening right buttock pain found to have abscess having failed outpatient Bactrim despite compliance, admitted overnight and taken to the OR early this morning with general surgery for I&D.  Tolerated procedure quite well, continue antibiotics until cultures result for de-escalation.  Pending postoperative course may be reasonable for discharge in the next 24 to 48 hours with transition to oral antibiotics pending culture sensitivity and postoperative pain management.   Azucena Fallen, DO [facility]  If 7PM-7AM, please contact night-coverage [email or web address]  07/12/2021, 12:27 PM

## 2021-07-12 NOTE — Op Note (Signed)
Preoperative diagnosis: Perianal abscess Postoperative diagnosis: Same as above Procedure: Incision and drainage of perianal abscess Surgeon: Dr. Serita Grammes Anesthesia: General Estimated blood loss: Minimal Specimens: 1.  Cultures to microbiology 2.  Necrotic tissue to pathology Complications: None Drains: None Special count was correct x2 at end of operation Disposition to recovery stable condition  Indications: This is a 66 year old female diabetic who has had over a week of right gluteal pain and a mass.  She was treated by her primary care physician with antibiotics and this did not improve.  She presented to the ER with an obvious perianal abscess.  She underwent a CT scan which confirmed this.  I saw her earlier and discussed incision and drainage.  Procedure: After informed consent was obtained the patient was taken the operating.  She was already on antibiotics.  SCDs were in place.  She was placed under general anesthesia without complication.  She was placed in the right lateral position and appropriately padded.  She was prepped and draped in the standard sterile surgical fashion.  A surgical timeout was then performed.  I filtrated quarter percent Marcaine around the area that was ulcerated and was already draining some.  Then made a elliptical incision overlying this ulceration and removed it.  There was a large volume of purulence present that I evacuated.  Cultures were taken.  There was also a very large portion of necrotic tissue that I removed.  This is not a necrotizing soft tissue infection and the remainder of the tissue was alive.  I irrigated this.  I then packed it with iodoform.  Dressings were placed.  She was extubated having tolerated this well and transferred to recovery.

## 2021-07-12 NOTE — Anesthesia Postprocedure Evaluation (Signed)
Anesthesia Post Note  Patient: [name]  Procedure(s) Performed: IRRIGATION AND DEBRIDEMENT RIGHT GLUTEAL ABSCESS (Right)     Patient location during evaluation: PACU Anesthesia Type: General Level of consciousness: sedated Pain management: pain level controlled Vital Signs Assessment: post-procedure vital signs reviewed and stable Respiratory status: spontaneous breathing and respiratory function stable Cardiovascular status: stable Postop Assessment: no apparent nausea or vomiting Anesthetic complications: no   No notable events documented.  Last Vitals:  Vitals:   07/12/21 1025 07/12/21 1040  BP: (!) 110/56 107/60  Pulse: 80 82  Resp: 16 15  Temp:    SpO2: 98% 98%    Last Pain:  Vitals:   07/12/21 1105  TempSrc:   PainSc: 8                  Dwaine Pringle DANIEL

## 2021-07-12 NOTE — Anesthesia Preprocedure Evaluation (Addendum)
Anesthesia Evaluation  Patient identified by MRN, date of birth, ID band Patient awake    Reviewed: Allergy & Precautions, NPO status , Patient's Chart, lab work & pertinent test results  Airway Mallampati: II  TM Distance: >3 FB Neck ROM: Full    Dental no notable dental hx. (+) Dental Advisory Given   Pulmonary Current Smoker,    Pulmonary exam normal        Cardiovascular negative cardio ROS Normal cardiovascular exam     Neuro/Psych negative neurological ROS     GI/Hepatic negative GI ROS, Neg liver ROS,   Endo/Other  diabetes  Renal/GU Renal InsufficiencyRenal disease     Musculoskeletal negative musculoskeletal ROS (+)   Abdominal   Peds  Hematology negative hematology ROS (+) anemia ,   Anesthesia Other Findings   Reproductive/Obstetrics                            Anesthesia Physical Anesthesia Plan  ASA: 3  Anesthesia Plan: General   Post-op Pain Management: Celebrex PO (pre-op) and Tylenol PO (pre-op)   Induction:   PONV Risk Score and Plan: 3 and Ondansetron, Dexamethasone and Midazolam  Airway Management Planned: LMA and Oral ETT  Additional Equipment:   Intra-op Plan:   Post-operative Plan: Extubation in OR  Informed Consent: I have reviewed the patients History and Physical, chart, labs and discussed the procedure including the risks, benefits and alternatives for the proposed anesthesia with the patient or authorized representative who has indicated his/her understanding and acceptance.     Dental advisory given  Plan Discussed with: Anesthesiologist, CRNA and Surgeon  Anesthesia Plan Comments:        Anesthesia Quick Evaluation

## 2021-07-12 NOTE — Interval H&P Note (Signed)
History and Physical Interval Note:  07/12/2021 8:22 AM I have seen and examined patient and will plan I and D today [name]  has presented today for surgery, with the diagnosis of right gluteal abscess.  The various methods of treatment have been discussed with the patient and family. After consideration of risks, benefits and other options for treatment, the patient has consented to  Procedure(s): IRRIGATION AND DEBRIDEMENT RIGHT GLUTEAL ABSCESS (Right) as a surgical intervention.  The patient's history has been reviewed, patient examined, no change in status, stable for surgery.  I have reviewed the patient's chart and labs.  Questions were answered to the patient's satisfaction.     Rolm Bookbinder

## 2021-07-12 NOTE — Progress Notes (Signed)
Pharmacy Antibiotic Note  [name] is a 66 y.o. female admitted on 07/11/2021 with  buttock abscess, ?fistua .  Pharmacy has been consulted for Vancomycin dosing. WBC mildly elevated. Mild bump in Scr.   Plan: Vancomycin 1000 mg IV q24h >>>Estimated AUC: 486 Ceftriaxone per MD Trend WBC, temp, renal function  F/U infectious work-up Drug levels as indicated  Temp (24hrs), Avg:98.8 F (37.1 C), Min:98.7 F (37.1 C), Max:98.8 F (37.1 C)  Recent Labs  Lab 07/11/21 1307  WBC 12.1*  CREATININE 1.38*    CrCl cannot be calculated (Unknown ideal weight.).    No Known Allergies  Abran Duke, PharmD, BCPS Clinical Pharmacist Phone: [phone]

## 2021-07-12 NOTE — Transfer of Care (Signed)
Immediate Anesthesia Transfer of Care Note  Patient: [name]  Procedure(s) Performed: IRRIGATION AND DEBRIDEMENT RIGHT GLUTEAL ABSCESS (Right)  Patient Location: PACU  Anesthesia Type:General  Level of Consciousness: awake and drowsy  Airway & Oxygen Therapy: Patient Spontanous Breathing and Patient connected to face mask oxygen  Post-op Assessment: Report given to RN, Post -op Vital signs reviewed and stable and Patient moving all extremities X 4  Post vital signs: Reviewed and stable  Last Vitals:  Vitals Value Taken Time  BP 80/50 (60)   Temp    Pulse 82 07/12/21 1009  Resp 17 07/12/21 1009  SpO2 97 % 07/12/21 1009  Vitals shown include unvalidated device data.  Last Pain:  Vitals:   07/12/21 0839  TempSrc:   PainSc: 6          Complications: No notable events documented.

## 2021-07-13 ENCOUNTER — Encounter (HOSPITAL_COMMUNITY): Payer: Self-pay | Admitting: General Surgery

## 2021-07-13 LAB — CBC
HCT: 29.7 % — ABNORMAL LOW (ref 36.0–46.0)
Hemoglobin: 9.2 g/dL — ABNORMAL LOW (ref 12.0–15.0)
MCH: 26.2 pg (ref 26.0–34.0)
MCHC: 31 g/dL (ref 30.0–36.0)
MCV: 84.6 fL (ref 80.0–100.0)
Platelets: 436 10*3/uL — ABNORMAL HIGH (ref 150–400)
RBC: 3.51 MIL/uL — ABNORMAL LOW (ref 3.87–5.11)
RDW: 14.7 % (ref 11.5–15.5)
WBC: 11.5 10*3/uL — ABNORMAL HIGH (ref 4.0–10.5)
nRBC: 0 % (ref 0.0–0.2)

## 2021-07-13 LAB — GLUCOSE, CAPILLARY
Glucose-Capillary: 287 mg/dL — ABNORMAL HIGH (ref 70–99)
Glucose-Capillary: 167 mg/dL — ABNORMAL HIGH (ref 70–99)

## 2021-07-13 LAB — BASIC METABOLIC PANEL
Anion gap: 9 (ref 5–15)
BUN: 20 mg/dL (ref 8–23)
CO2: 19 mmol/L — ABNORMAL LOW (ref 22–32)
Calcium: 8.3 mg/dL — ABNORMAL LOW (ref 8.9–10.3)
Chloride: 106 mmol/L (ref 98–111)
Creatinine, Ser: 1.04 mg/dL — ABNORMAL HIGH (ref 0.44–1.00)
GFR, Estimated: >60 mL/min (ref >60)
Glucose, Bld: 303 mg/dL — ABNORMAL HIGH (ref 70–99)
Potassium: 5 mmol/L (ref 3.5–5.1)
Sodium: 134 mmol/L — ABNORMAL LOW (ref 135–145)

## 2021-07-13 LAB — SURGICAL PATHOLOGY

## 2021-07-13 MED ORDER — AMOXICILLIN-POT CLAVULANATE 875-125 MG PO TABS: 1.0000 | ORAL_TABLET | Freq: Two times a day (BID) | ORAL | 0 refills | Status: AC

## 2021-07-13 MED ORDER — HYDROCODONE-ACETAMINOPHEN 5-325 MG PO TABS: 1.0000 | ORAL_TABLET | ORAL | 0 refills | Status: AC | PRN

## 2021-07-13 NOTE — Discharge Summary (Signed)
Physician Discharge Summary  DIOSELINA BRUMBAUGH MWU:[id number] DOB: 08-06-1955 DOA: 07/11/2021  PCP: Patient, No Pcp Per (Inactive)  Admit date: 07/11/2021 Discharge date: 07/13/2021  Admitted From: Home Disposition: Home  Recommendations for Outpatient Follow-up:  Follow up with PCP in 1-2 weeks Please obtain BMP/CBC in one week Please follow up with surgery as scheduled  Discharge Condition: Stable CODE STATUS: Full Diet recommendation: As tolerated diabetic diet  Brief/Interim Summary: JAQUILLA WOODROOF is a 66 y.o. female with medical history significant for type 2 diabetes mellitus who is admitted to [facility] on 07/11/2021 with abscess associated with right buttock after presenting from home to [facility] ED complaining of pain/erythema associated with right buttock.   Patient tolerated perianal abscess incision and drainage with surgery on 07/12/2021 without complication, pain currently well controlled, transition to p.o. Augmentin and discharged with outpatient surgical follow-up per their expertise.  No chronic medication changes during hospital stay  Discharge Instructions   Allergies as of 07/13/2021   No Known Allergies      Medication List     STOP taking these medications    insulin detemir 100 UNIT/ML injection Commonly known as: LEVEMIR       TAKE these medications    ALIVE ONCE DAILY WOMENS PO Take 1 tablet by mouth daily.   amoxicillin-clavulanate 875-125 MG tablet Commonly known as: Augmentin Take 1 tablet by mouth 2 (two) times daily for 8 days.   aspirin EC 81 MG tablet Take 81 mg by mouth every other day.   B COMPLEX-VITAMIN B12 PO Take 1 tablet by mouth daily.   HYDROcodone-acetaminophen 5-325 MG tablet Commonly known as: NORCO/VICODIN Take 1 tablet by mouth every 4 (four) hours as needed for up to 5 days for moderate pain.   losartan 100 MG tablet Commonly known as: COZAAR Take 100 mg by mouth daily.   metFORMIN 1000 MG tablet Commonly  known as: GLUCOPHAGE Take 500 mg by mouth 2 (two) times daily with a meal.   sulfamethoxazole-trimethoprim 800-160 MG tablet Commonly known as: BACTRIM DS Take 1 tablet by mouth 2 (two) times daily. For 10 days   VITAMIN D PO Take 1 tablet by mouth daily.        Follow-up Information     [facility]. Call.   Specialty: General Surgery Why: We are working to make your follow up appointment for you. Please call to confirm appointment date and time Contact information: [address] [place] [address] [phone]                No Known Allergies  Consultations: General surgery  Procedures/Studies: CT PELVIS W CONTRAST  Result Date: 07/11/2021 CLINICAL DATA:  Right sided buttock pain and abscess. EXAM: CT PELVIS WITH CONTRAST TECHNIQUE: Multidetector CT imaging of the pelvis was performed using the standard protocol following the bolus administration of intravenous contrast. RADIATION DOSE REDUCTION: This exam was performed according to the departmental dose-optimization program which includes automated exposure control, adjustment of the mA and/or kV according to patient size and/or use of iterative reconstruction technique. CONTRAST:  49mL OMNIPAQUE IOHEXOL 350 MG/ML SOLN COMPARISON:  None. FINDINGS: Lower Urinary Tract: Unremarkable urinary bladder. Bowel: No abnormality seen involving intrapelvic bowel loops. In the right posteromedial buttock there is a poorly defined fluid collection with early peripheral enhancement which measures approximately 5.7 x 5.6 cm. Surrounding edema is seen within the subcutaneous tissues of the right buttock. This is consistent with an phlegmon or early abscess. There  is also a linear low-attenuation tract extending anteriorly to the skin surface of the right posterior labia, and possibly the right lateral wall of the anus. This is consistent with a right buttock abscess, and is suspicious for a perianal fistula with  abscess. Vascular/Lymphatic: No pathologically enlarged lymph nodes or other significant abnormality. Reproductive: No mass or other significant abnormality. Clips are seen from previous bilateral tubal ligation. Other: None. Musculoskeletal: No suspicious bone lesions identified. IMPRESSION: 5.7 cm ill-defined defined fluid collection in right posteromedial buttock subcutaneous tissues with surrounding inflammatory changes, and probable fistulous connection to the right lateral anal wall and perineum. 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 Nonemergent MRI could be obtained as an outpatient for treatment planning purposes if clinically warranted. Electronically Signed   By: Danae Orleans M.D.   On: 07/11/2021 18:22     Subjective: No acute issues or events overnight pain well controlled denies nausea vomiting diarrhea constipation headache fevers chills or chest pain   Discharge Exam: Vitals:   07/13/21 0625 07/13/21 0738  BP: 136/79 (!) 105/57  Pulse: 80 69  Resp: 18 16  Temp: 98.4 F (36.9 C) 98.5 F (36.9 C)  SpO2: 97% 97%   Vitals:   07/12/21 1640 07/12/21 1946 07/13/21 0625 07/13/21 0738  BP: (!) 107/53 122/66 136/79 (!) 105/57  Pulse: 78 85 80 69  Resp: 20 18 18 16   Temp: 98.2 F (36.8 C) 98.6 F (37 C) 98.4 F (36.9 C) 98.5 F (36.9 C)  TempSrc: Oral Oral Oral Oral  SpO2: 90% 98% 97% 97%  Weight:   83.5 kg   Height:        General: Pt is alert, awake, not in acute distress Cardiovascular: RRR, S1/S2 +, no rubs, no gallops Respiratory: CTA bilaterally, no wheezing, no rhonchi Abdominal: Soft, NT, ND, bowel sounds + Extremities: no edema, no cyanosis    The results of significant diagnostics from this hospitalization (including imaging, microbiology, ancillary and laboratory) are listed below for reference.     Microbiology: Recent Results (from the past 240 hour(s))  Resp Panel by RT-PCR (Flu A&B, Covid) Nasopharyngeal Swab     Status:  None   Collection Time: 07/11/21 10:51 PM   Specimen: Nasopharyngeal Swab; Nasopharyngeal(NP) swabs in vial transport medium  Result Value Ref Range Status   SARS Coronavirus 2 by RT PCR NEGATIVE NEGATIVE Final    Comment: (NOTE) SARS-CoV-2 target nucleic acids are NOT DETECTED.  The SARS-CoV-2 RNA is generally detectable in upper respiratory specimens during the acute phase of infection. The lowest concentration of SARS-CoV-2 viral copies this assay can detect is 138 copies/mL. A negative result does not preclude SARS-Cov-2 infection and should not be used as the sole basis for treatment or other patient management decisions. A negative result may occur with  improper specimen collection/handling, submission of specimen other than nasopharyngeal swab, presence of viral mutation(s) within the areas targeted by this assay, and inadequate number of viral copies(<138 copies/mL). A negative result must be combined with clinical observations, patient history, and epidemiological information. The expected result is Negative.  Fact Sheet for Patients:  07/13/21  Fact Sheet for Healthcare Providers:  BloggerCourse.com  This test is no t yet approved or cleared by the [place] FDA and  has been authorized for detection and/or diagnosis of SARS-CoV-2 by FDA under an Emergency Use Authorization (EUA). This EUA will remain  in effect (meaning this test can be used) for the duration of the COVID-19 declaration  under Section 564(b)(1) of the Act, 21 U.S.C.section 360bbb-3(b)(1), unless the authorization is terminated  or revoked sooner.       Influenza A by PCR NEGATIVE NEGATIVE Final   Influenza B by PCR NEGATIVE NEGATIVE Final    Comment: (NOTE) The Xpert Xpress SARS-CoV-2/FLU/RSV plus assay is intended as an aid in the diagnosis of influenza from Nasopharyngeal swab specimens and should not be used as a sole basis for  treatment. Nasal washings and aspirates are unacceptable for Xpert Xpress SARS-CoV-2/FLU/RSV testing.  Fact Sheet for Patients: BloggerCourse.com  Fact Sheet for Healthcare Providers: SeriousBroker.it  This test is not yet approved or cleared by the [place] FDA and has been authorized for detection and/or diagnosis of SARS-CoV-2 by FDA under an Emergency Use Authorization (EUA). This EUA will remain in effect (meaning this test can be used) for the duration of the COVID-19 declaration under Section 564(b)(1) of the Act, 21 U.S.C. section 360bbb-3(b)(1), unless the authorization is terminated or revoked.  Performed at [facility], [address], [place], [place] [address]   Culture, blood (Routine X 2) w Reflex to ID Panel     Status: None (Preliminary result)   Collection Time: 07/12/21  4:29 AM   Specimen: BLOOD RIGHT WRIST  Result Value Ref Range Status   Specimen Description BLOOD RIGHT WRIST  Final   Special Requests   Final    BOTTLES DRAWN AEROBIC AND ANAEROBIC Blood Culture adequate volume   Culture   Final    NO GROWTH 1 DAY Performed at [facility], [address] [place], [place] [address]    Report Status PENDING  Incomplete  Aerobic/Anaerobic Culture w Gram Stain (surgical/deep wound)     Status: None (Preliminary result)   Collection Time: 07/12/21  9:00 AM   Specimen: Abscess  Result Value Ref Range Status   Specimen Description ABSCESS  Final   Special Requests RIGHT PERIANAL ABSCESS SPEC A  Final   Gram Stain   Final    MODERATE WBC PRESENT,BOTH PMN AND MONONUCLEAR MODERATE GRAM POSITIVE COCCI MODERATE GRAM VARIABLE ROD    Culture   Final    CULTURE REINCUBATED FOR BETTER GROWTH Performed at [facility], [address] [place], [place] [address]    Report Status PENDING  Incomplete  Culture, blood (Routine X 2) w Reflex to ID Panel     Status: None (Preliminary result)    Collection Time: 07/12/21  3:40 PM   Specimen: Right Antecubital; Blood  Result Value Ref Range Status   Specimen Description RIGHT ANTECUBITAL  Final   Special Requests   Final    BOTTLES DRAWN AEROBIC AND ANAEROBIC Blood Culture adequate volume   Culture   Final    NO GROWTH < 24 HOURS Performed at [facility], [address] [place], [place] [address]    Report Status PENDING  Incomplete     Labs: BNP (last 3 results) No results for input(s): BNP in the last 8760 hours. Basic Metabolic Panel: Recent Labs  Lab 07/11/21 1307 07/12/21 0429 07/13/21 0239  NA 133* 133* 134*  K 4.8 5.2* 5.0  CL 106 106 106  CO2 20* 18* 19*  GLUCOSE 397* 338* 303*  BUN 15 16 20   CREATININE 1.38* 1.16* 1.04*  CALCIUM 8.5* 8.1* 8.3*  MG  --  1.5*  --    Liver Function Tests: Recent Labs  Lab 07/11/21 1307 07/12/21 0429  AST 12* 13*  ALT 11  12  ALKPHOS 130* 133*  BILITOT 0.5 0.3  PROT 6.8 6.3*  ALBUMIN 2.6* 2.4*   No results for input(s): LIPASE, AMYLASE in the last 168 hours. No results for input(s): AMMONIA in the last 168 hours. CBC: Recent Labs  Lab 07/11/21 1307 07/12/21 0429 07/13/21 0239  WBC 12.1* 10.4 11.5*  NEUTROABS 8.9* 7.2  --   HGB 10.7* 9.9* 9.2*  HCT 35.0* 31.7* 29.7*  MCV 84.7 85.2 84.6  PLT 513* 431* 436*   Cardiac Enzymes: No results for input(s): CKTOTAL, CKMB, CKMBINDEX, TROPONINI in the last 168 hours. BNP: Invalid input(s): POCBNP CBG: Recent Labs  Lab 07/12/21 1305 07/12/21 1752 07/12/21 2307 07/13/21 0608 07/13/21 1133  GLUCAP 157* 301* 432* 287* 167*   D-Dimer No results for input(s): DDIMER in the last 72 hours. Hgb A1c No results for input(s): HGBA1C in the last 72 hours. Lipid Profile No results for input(s): CHOL, HDL, LDLCALC, TRIG, CHOLHDL, LDLDIRECT in the last 72 hours. Thyroid function studies No results for input(s): TSH, T4TOTAL, T3FREE, THYROIDAB in the last 72 hours.  Invalid input(s): FREET3 Anemia work up No  results for input(s): VITAMINB12, FOLATE, FERRITIN, TIBC, IRON, RETICCTPCT in the last 72 hours. Urinalysis    Component Value Date/Time   COLORURINE YELLOW 09/20/2016 2101   APPEARANCEUR CLOUDY (A) 09/20/2016 2101   LABSPEC 1.010 09/20/2016 2101   PHURINE 5.0 09/20/2016 2101   GLUCOSEU 50 (A) 09/20/2016 2101   HGBUR NEGATIVE 09/20/2016 2101   BILIRUBINUR NEGATIVE 09/20/2016 2101   KETONESUR NEGATIVE 09/20/2016 2101   PROTEINUR 100 (A) 09/20/2016 2101   UROBILINOGEN 1.0 07/09/2010 0047   NITRITE NEGATIVE 09/20/2016 2101   LEUKOCYTESUR TRACE (A) 09/20/2016 2101   Sepsis Labs Invalid input(s): PROCALCITONIN,  WBC,  LACTICIDVEN Microbiology Recent Results (from the past 240 hour(s))  Resp Panel by RT-PCR (Flu A&B, Covid) Nasopharyngeal Swab     Status: None   Collection Time: 07/11/21 10:51 PM   Specimen: Nasopharyngeal Swab; Nasopharyngeal(NP) swabs in vial transport medium  Result Value Ref Range Status   SARS Coronavirus 2 by RT PCR NEGATIVE NEGATIVE Final    Comment: (NOTE) SARS-CoV-2 target nucleic acids are NOT DETECTED.  The SARS-CoV-2 RNA is generally detectable in upper respiratory specimens during the acute phase of infection. The lowest concentration of SARS-CoV-2 viral copies this assay can detect is 138 copies/mL. A negative result does not preclude SARS-Cov-2 infection and should not be used as the sole basis for treatment or other patient management decisions. A negative result may occur with  improper specimen collection/handling, submission of specimen other than nasopharyngeal swab, presence of viral mutation(s) within the areas targeted by this assay, and inadequate number of viral copies(<138 copies/mL). A negative result must be combined with clinical observations, patient history, and epidemiological information. The expected result is Negative.  Fact Sheet for Patients:  BloggerCourse.comhttps://www.fda.gov/media/152166/download  Fact Sheet for Healthcare Providers:   SeriousBroker.ithttps://www.fda.gov/media/152162/download  This test is no t yet approved or cleared by the [place] FDA and  has been authorized for detection and/or diagnosis of SARS-CoV-2 by FDA under an Emergency Use Authorization (EUA). This EUA will remain  in effect (meaning this test can be used) for the duration of the COVID-19 declaration under Section 564(b)(1) of the Act, 21 U.S.C.section 360bbb-3(b)(1), unless the authorization is terminated  or revoked sooner.       Influenza A by PCR NEGATIVE NEGATIVE Final   Influenza B by PCR NEGATIVE NEGATIVE Final    Comment: (NOTE) The Xpert Xpress SARS-CoV-2/FLU/RSV  plus assay is intended as an aid in the diagnosis of influenza from Nasopharyngeal swab specimens and should not be used as a sole basis for treatment. Nasal washings and aspirates are unacceptable for Xpert Xpress SARS-CoV-2/FLU/RSV testing.  Fact Sheet for Patients: BloggerCourse.comhttps://www.fda.gov/media/152166/download  Fact Sheet for Healthcare Providers: SeriousBroker.ithttps://www.fda.gov/media/152162/download  This test is not yet approved or cleared by the [place] FDA and has been authorized for detection and/or diagnosis of SARS-CoV-2 by FDA under an Emergency Use Authorization (EUA). This EUA will remain in effect (meaning this test can be used) for the duration of the COVID-19 declaration under Section 564(b)(1) of the Act, 21 U.S.C. section 360bbb-3(b)(1), unless the authorization is terminated or revoked.  Performed at [facility], [address] [place], [place] [address]   Culture, blood (Routine X 2) w Reflex to ID Panel     Status: None (Preliminary result)   Collection Time: 07/12/21  4:29 AM   Specimen: BLOOD RIGHT WRIST  Result Value Ref Range Status   Specimen Description BLOOD RIGHT WRIST  Final   Special Requests   Final    BOTTLES DRAWN AEROBIC AND ANAEROBIC Blood Culture adequate volume   Culture   Final    NO GROWTH 1 DAY Performed at [facility], [address] [place], [place] [address]    Report Status PENDING  Incomplete  Aerobic/Anaerobic Culture w Gram Stain (surgical/deep wound)     Status: None (Preliminary result)   Collection Time: 07/12/21  9:00 AM   Specimen: Abscess  Result Value Ref Range Status   Specimen Description ABSCESS  Final   Special Requests RIGHT PERIANAL ABSCESS SPEC A  Final   Gram Stain   Final    MODERATE WBC PRESENT,BOTH PMN AND MONONUCLEAR MODERATE GRAM POSITIVE COCCI MODERATE GRAM VARIABLE ROD    Culture   Final    CULTURE REINCUBATED FOR BETTER GROWTH Performed at [facility], [address] [place], [place] [address]    Report Status PENDING  Incomplete  Culture, blood (Routine X 2) w Reflex to ID Panel     Status: None (Preliminary result)   Collection Time: 07/12/21  3:40 PM   Specimen: Right Antecubital; Blood  Result Value Ref Range Status   Specimen Description RIGHT ANTECUBITAL  Final   Special Requests   Final    BOTTLES DRAWN AEROBIC AND ANAEROBIC Blood Culture adequate volume   Culture   Final    NO GROWTH < 24 HOURS Performed at [facility], [address] [place], [place] [address]    Report Status PENDING  Incomplete     Time coordinating discharge: Over 30 minutes  SIGNED:   Azucena FallenWilliam C Colin Ellers, DO [facility] 07/13/2021, 5:20 PM Pager   If 7PM-7AM, please contact night-coverage [email or web address]

## 2021-07-13 NOTE — Progress Notes (Signed)
Progress Note  1 Day Post-Op  Subjective: Having some expected post operative pain. Ambulating. Tolerating diet   Objective: Vital signs in last 24 hours: Temp:  [97.6 F (36.4 C)-98.6 F (37 C)] 98.5 F (36.9 C) (01/27 0738) Pulse Rate:  [69-85] 69 (01/27 0738) Resp:  [15-21] 16 (01/27 0738) BP: (80-136)/(50-79) 105/57 (01/27 0738) SpO2:  [90 %-99 %] 97 % (01/27 0738) Weight:  [77.1 kg-83.5 kg] 83.5 kg (01/27 0625) Last BM Date: 07/11/21  Intake/Output from previous day: 01/26 0701 - 01/27 0700 In: 3256.7 [P.O.:250; I.V.:2600; IV Piggyback:406.7] Out: 10 [Blood:10] Intake/Output this shift: No intake/output data recorded.  PE: General: pleasant, WD, female who is laying in bed in NAD HEENT: head is normocephalic, atraumatic.  Mouth is pink and moist Heart:  Palpable radial pulses bilaterally Lungs: Respiratory effort nonlabored Abd: soft, NT, ND MSK: all 4 extremities are symmetrical with no cyanosis, clubbing, or edema. Skin: warm and dry Perianal abscess incision with healthy margins. Packing removed - bloody purulence tinged fluid. Mild surrounding induration and erythema Psych: A&Ox3 with an appropriate affect.    Lab Results:  Recent Labs    07/12/21 0429 07/13/21 0239  WBC 10.4 11.5*  HGB 9.9* 9.2*  HCT 31.7* 29.7*  PLT 431* 436*   BMET Recent Labs    07/12/21 0429 07/13/21 0239  NA 133* 134*  K 5.2* 5.0  CL 106 106  CO2 18* 19*  GLUCOSE 338* 303*  BUN 16 20  CREATININE 1.16* 1.04*  CALCIUM 8.1* 8.3*   PT/INR Recent Labs    07/12/21 0429  LABPROT 14.1  INR 1.1   CMP     Component Value Date/Time   NA 134 (L) 07/13/2021 0239   K 5.0 07/13/2021 0239   CL 106 07/13/2021 0239   CO2 19 (L) 07/13/2021 0239   GLUCOSE 303 (H) 07/13/2021 0239   BUN 20 07/13/2021 0239   CREATININE 1.04 (H) 07/13/2021 0239   CALCIUM 8.3 (L) 07/13/2021 0239   PROT 6.3 (L) 07/12/2021 0429   ALBUMIN 2.4 (L) 07/12/2021 0429   AST 13 (L) 07/12/2021 0429    ALT 12 07/12/2021 0429   ALKPHOS 133 (H) 07/12/2021 0429   BILITOT 0.3 07/12/2021 0429   GFRNONAA >60 07/13/2021 0239   GFRAA >60 09/21/2016 0700   Lipase  No results found for: LIPASE     Studies/Results: CT PELVIS W CONTRAST  Result Date: 07/11/2021 CLINICAL DATA:  Right sided buttock pain and abscess. EXAM: CT PELVIS WITH CONTRAST TECHNIQUE: Multidetector CT imaging of the pelvis was performed using the standard protocol following the bolus administration of intravenous contrast. RADIATION DOSE REDUCTION: This exam was performed according to the departmental dose-optimization program which includes automated exposure control, adjustment of the mA and/or kV according to patient size and/or use of iterative reconstruction technique. CONTRAST:  71mL OMNIPAQUE IOHEXOL 350 MG/ML SOLN COMPARISON:  None. FINDINGS: Lower Urinary Tract: Unremarkable urinary bladder. Bowel: No abnormality seen involving intrapelvic bowel loops. In the right posteromedial buttock there is a poorly defined fluid collection with early peripheral enhancement which measures approximately 5.7 x 5.6 cm. Surrounding edema is seen within the subcutaneous tissues of the right buttock. This is consistent with an phlegmon or early abscess. There is also a linear low-attenuation tract extending anteriorly to the skin surface of the right posterior labia, and possibly the right lateral wall of the anus. This is consistent with a right buttock abscess, and is suspicious for a perianal fistula with abscess. Vascular/Lymphatic: No pathologically  enlarged lymph nodes or other significant abnormality. Reproductive: No mass or other significant abnormality. Clips are seen from previous bilateral tubal ligation. Other: None. Musculoskeletal: No suspicious bone lesions identified. IMPRESSION: 5.7 cm ill-defined defined fluid collection in right posteromedial buttock subcutaneous tissues with surrounding inflammatory changes, and probable  fistulous connection to the right lateral anal wall and perineum. 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 Nonemergent MRI could be obtained as an outpatient for treatment planning purposes if clinically warranted. Electronically Signed   By: Danae Orleans M.D.   On: 07/11/2021 18:22    Anti-infectives: Anti-infectives (From admission, onward)    Start     Dose/Rate Route Frequency Ordered Stop   07/12/21 2200  vancomycin (VANCOCIN) IVPB 1000 mg/200 mL premix        1,000 mg 200 mL/hr over 60 Minutes Intravenous Every 24 hours 07/12/21 0226     07/12/21 0230  cefTRIAXone (ROCEPHIN) 1 g in sodium chloride 0.9 % 100 mL IVPB        1 g 200 mL/hr over 30 Minutes Intravenous Every 24 hours 07/12/21 0217     07/11/21 2300  vancomycin (VANCOCIN) IVPB 1000 mg/200 mL premix        1,000 mg 200 mL/hr over 60 Minutes Intravenous  Once 07/11/21 2248 07/12/21 0055   07/11/21 2300  ceFEPIme (MAXIPIME) 2 g in sodium chloride 0.9 % 100 mL IVPB        2 g 200 mL/hr over 30 Minutes Intravenous  Once 07/11/21 2254 07/11/21 2352        Assessment/Plan Perianal abscess POD1 s/p I&D in OR by Dr. Dwain Sarna 1/26 - packing removed - will need 5 days abx post I&D - can transition to augmentin PO on dc - recommend daily sitz baths, hydration, and constipation prevention - will arrange follow up  Stable for dc from surgical perspective  I reviewed last 24 h vitals and pain scores, last 48 h intake and output, and last 24 h labs and trends.  This care required low level of medical decision making.    LOS: 1 day   Eric Form, [facility] Surgery 07/13/2021, 8:15 AM Please see Amion for pager number during day hours 7:00am-4:30pm

## 2021-07-17 LAB — CULTURE, BLOOD (ROUTINE X 2)
Culture: NO GROWTH
Culture: NO GROWTH
Special Requests: ADEQUATE
Special Requests: ADEQUATE

## 2021-07-18 LAB — AEROBIC/ANAEROBIC CULTURE W GRAM STAIN (SURGICAL/DEEP WOUND)

## 2023-06-04 IMAGING — CT CT PELVIS W/ CM
2 of 4 series · 16 of 46 positions shown, 18 images · IV contrast (agent unspecified)
Comparison: None.

CLINICAL DATA: Right sided buttock pain and abscess.

EXAM:
CT PELVIS WITH CONTRAST
TECHNIQUE: Multidetector CT imaging of the pelvis was performed using the
standard protocol following the bolus administration of intravenous
contrast.

[Series 8: coronal st · coronal · 0.66mm/px · 3 of 162 slices shown]
[im 54/162  soft-tissue]
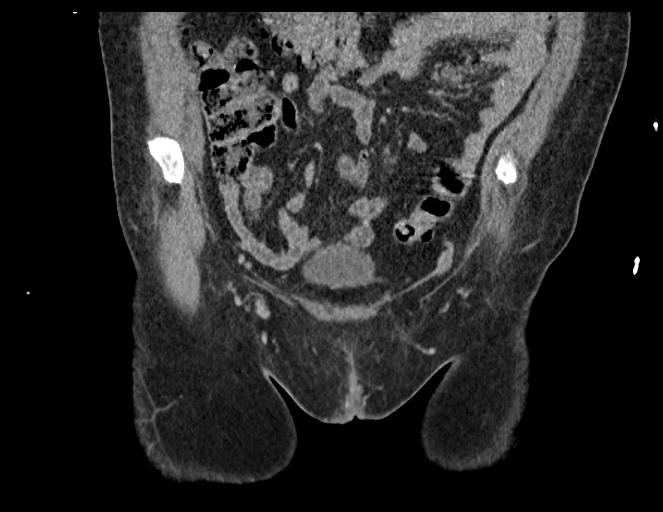
[im 72/162  soft-tissue]
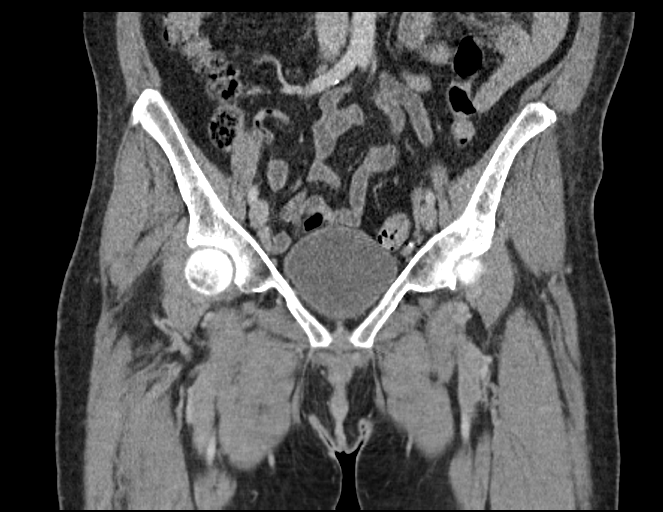
[im 90/162  soft-tissue]
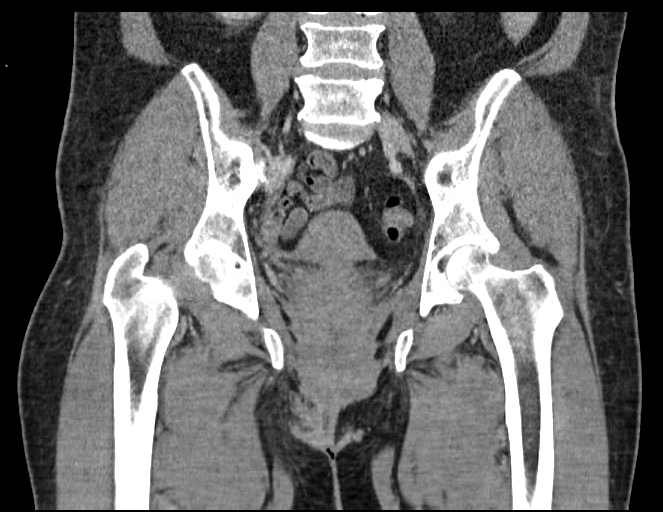

[Series 10: pelvis thin · axial · 0.92mm/px · z∈[+732,+1026]mm · 13 of 536 slices shown, 15 images]
[im 23/536  soft-tissue]
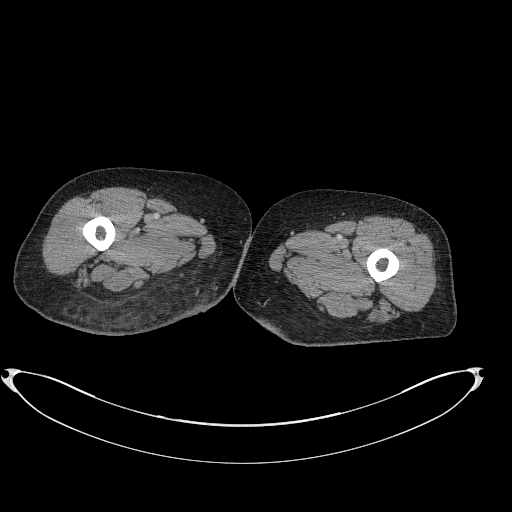
[im 23/536  bone]
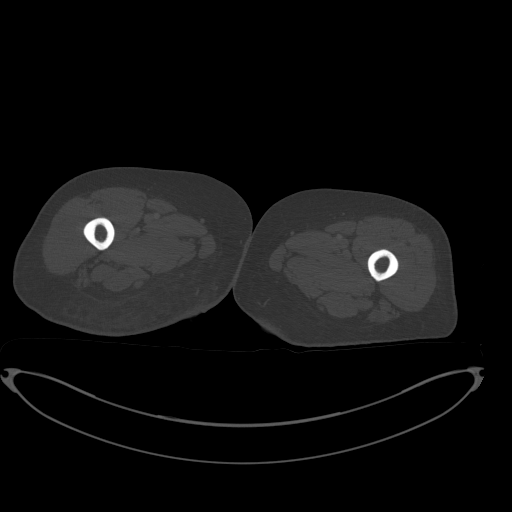
[im 67/536  soft-tissue]
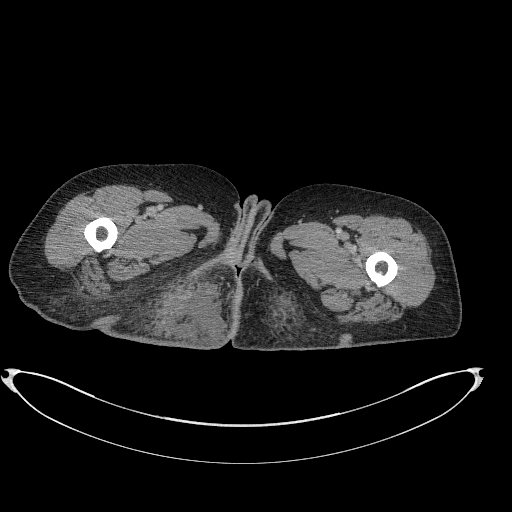
[im 112/536  soft-tissue]
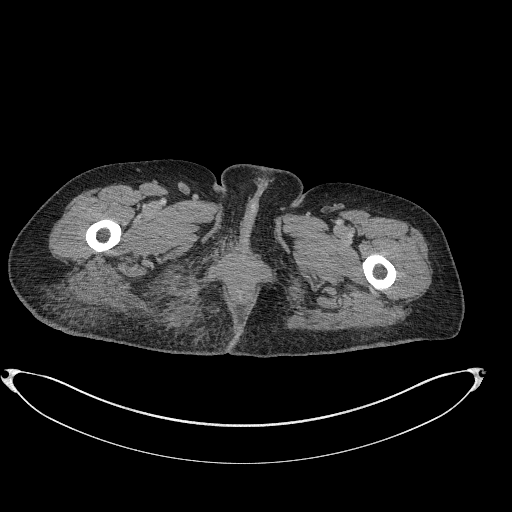
[im 157/536  soft-tissue]
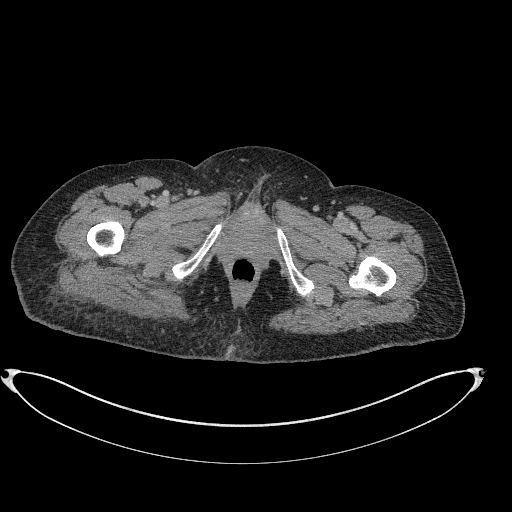
[im 179/536  soft-tissue]
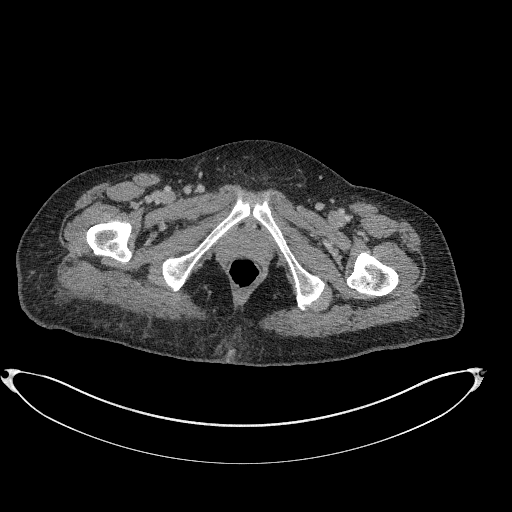
[im 223/536  soft-tissue]
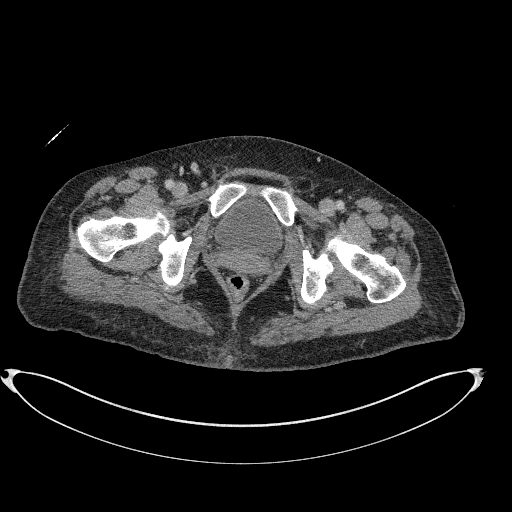
[im 268/536  soft-tissue]
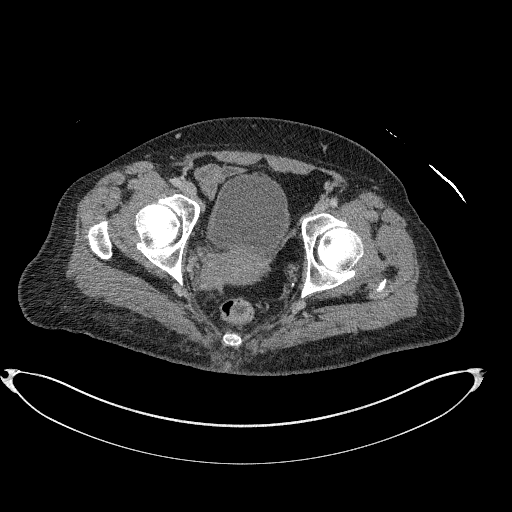
[im 313/536  soft-tissue]
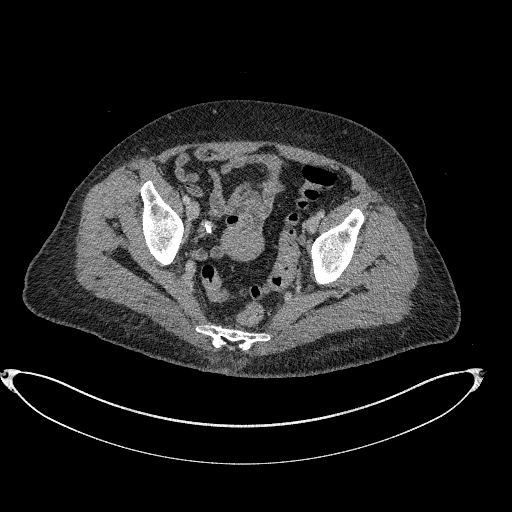
[im 357/536  soft-tissue]
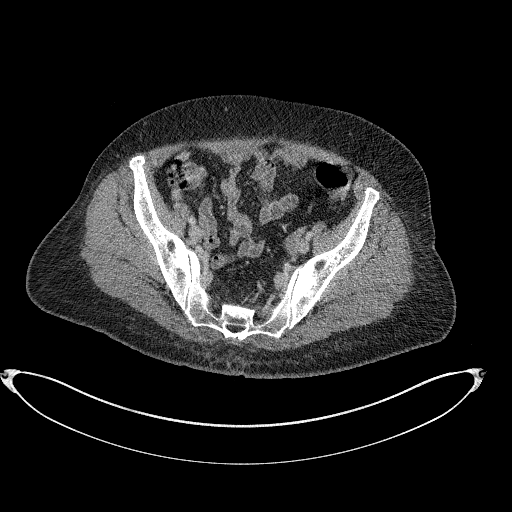
[im 357/536  bone]
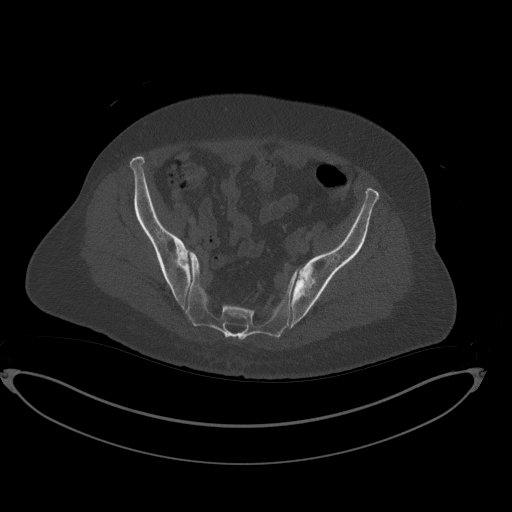
[im 379/536  soft-tissue]
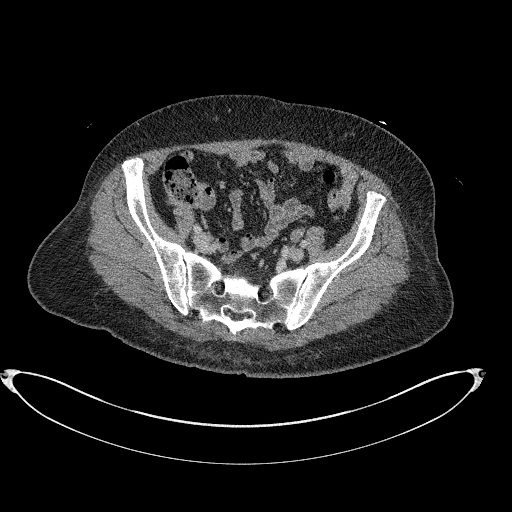
[im 424/536  soft-tissue]
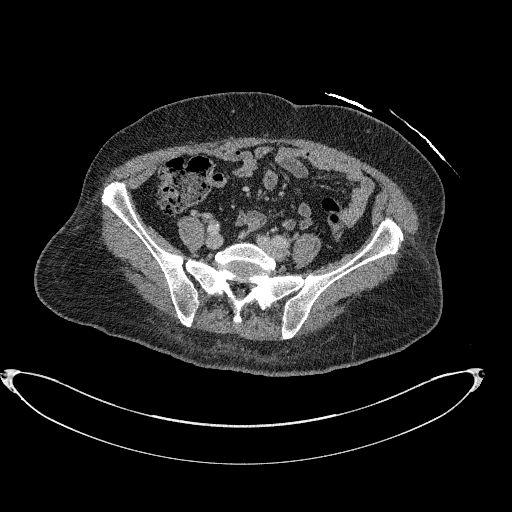
[im 469/536  soft-tissue]
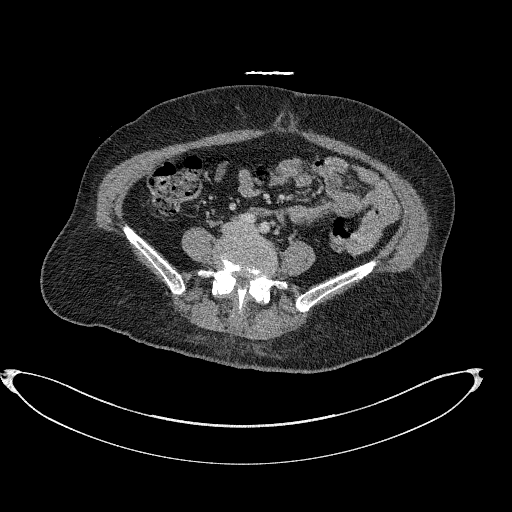
[im 513/536  soft-tissue]
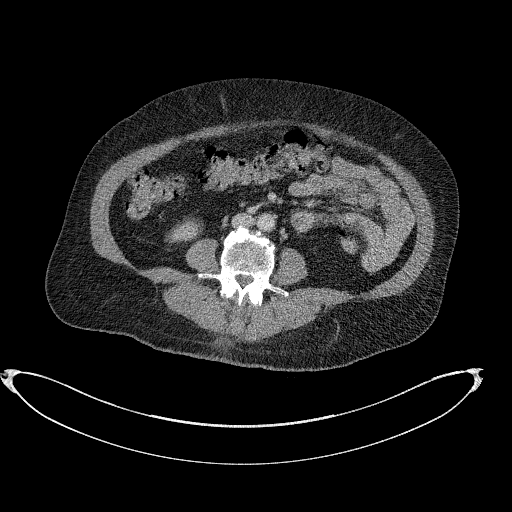

[16 of 46 positions shown; findings below may reference images not displayed]

RADIATION DOSE REDUCTION: This exam was performed according to the
departmental dose-optimization program which includes automated
exposure control, adjustment of the mA and/or kV according to
patient size and/or use of iterative reconstruction technique.

CONTRAST:  80mL OMNIPAQUE IOHEXOL 350 MG/ML SOLN
FINDINGS: Lower Urinary Tract: Unremarkable urinary bladder.

Bowel: No abnormality seen involving intrapelvic bowel loops. In the
right posteromedial buttock there is a poorly defined fluid
collection with early peripheral enhancement which measures
approximately 5.7 x 5.6 cm. Surrounding edema is seen within the
subcutaneous tissues of the right buttock. This is consistent with
an phlegmon or early abscess. There is also a linear low-attenuation
tract extending anteriorly to the skin surface of the right
posterior labia, and possibly the right lateral wall of the anus.
This is consistent with a right buttock abscess, and is suspicious
for a perianal fistula with abscess.

Vascular/Lymphatic: No pathologically enlarged lymph nodes or other
significant abnormality.

Reproductive: No mass or other significant abnormality. Clips are
seen from previous bilateral tubal ligation.

Other: None.

Musculoskeletal: No suspicious bone lesions identified.
IMPRESSION: 5.7 cm ill-defined defined fluid collection in right posteromedial
buttock subcutaneous tissues with surrounding inflammatory changes,
and probable fistulous connection to the right lateral anal wall and
perineum. This is suspicious for a complex right perianal fistula
with abscess. Nonemergent MRI could be obtained as an outpatient for
treatment planning purposes if clinically warranted.
